# Patient Record
Sex: Female | Born: 1955 | Race: White | Hispanic: No | Smoking: Former smoker
Health system: Southern US, Community
[De-identification: ages and names within clinical notes are randomized; demographics above are authoritative.]

## PROBLEM LIST (undated history)

## (undated) DIAGNOSIS — K219 Gastro-esophageal reflux disease without esophagitis: Secondary | ICD-10-CM

## (undated) DIAGNOSIS — Z8719 Personal history of other diseases of the digestive system: Secondary | ICD-10-CM

## (undated) DIAGNOSIS — K649 Unspecified hemorrhoids: Secondary | ICD-10-CM

## (undated) DIAGNOSIS — R238 Other skin changes: Secondary | ICD-10-CM

## (undated) DIAGNOSIS — R112 Nausea with vomiting, unspecified: Secondary | ICD-10-CM

## (undated) DIAGNOSIS — J189 Pneumonia, unspecified organism: Secondary | ICD-10-CM

## (undated) DIAGNOSIS — R233 Spontaneous ecchymoses: Secondary | ICD-10-CM

## (undated) DIAGNOSIS — N390 Urinary tract infection, site not specified: Secondary | ICD-10-CM

## (undated) DIAGNOSIS — R7303 Prediabetes: Secondary | ICD-10-CM

## (undated) DIAGNOSIS — K8020 Calculus of gallbladder without cholecystitis without obstruction: Secondary | ICD-10-CM

## (undated) DIAGNOSIS — Z9289 Personal history of other medical treatment: Secondary | ICD-10-CM

## (undated) DIAGNOSIS — C4491 Basal cell carcinoma of skin, unspecified: Secondary | ICD-10-CM

## (undated) DIAGNOSIS — J45909 Unspecified asthma, uncomplicated: Secondary | ICD-10-CM

## (undated) HISTORY — PX: KNEE SURGERY: SHX244

## (undated) HISTORY — PX: DILATION AND CURETTAGE OF UTERUS: SHX78

## (undated) HISTORY — DX: Urinary tract infection, site not specified: N39.0

## (undated) HISTORY — DX: Unspecified asthma, uncomplicated: J45.909

## (undated) HISTORY — DX: Unspecified hemorrhoids: K64.9

## (undated) HISTORY — PX: MYOMECTOMY: SHX85

## (undated) HISTORY — PX: BREAST BIOPSY: SHX20

---

## 2009-01-06 ENCOUNTER — Encounter: Payer: Self-pay | Admitting: Internal Medicine

## 2009-01-13 DIAGNOSIS — J4540 Moderate persistent asthma, uncomplicated: Secondary | ICD-10-CM | POA: Insufficient documentation

## 2009-01-13 DIAGNOSIS — J309 Allergic rhinitis, unspecified: Secondary | ICD-10-CM | POA: Insufficient documentation

## 2009-01-13 DIAGNOSIS — K219 Gastro-esophageal reflux disease without esophagitis: Secondary | ICD-10-CM | POA: Insufficient documentation

## 2009-01-13 DIAGNOSIS — E785 Hyperlipidemia, unspecified: Secondary | ICD-10-CM

## 2009-01-14 ENCOUNTER — Ambulatory Visit: Payer: Self-pay | Admitting: Internal Medicine

## 2009-06-05 DIAGNOSIS — I639 Cerebral infarction, unspecified: Secondary | ICD-10-CM

## 2009-06-05 HISTORY — DX: Cerebral infarction, unspecified: I63.9

## 2009-07-13 ENCOUNTER — Ambulatory Visit: Payer: Self-pay | Admitting: Internal Medicine

## 2009-07-26 ENCOUNTER — Encounter: Payer: Self-pay | Admitting: Internal Medicine

## 2009-08-06 ENCOUNTER — Ambulatory Visit: Payer: Self-pay | Admitting: Internal Medicine

## 2009-09-12 ENCOUNTER — Emergency Department: Payer: Self-pay | Admitting: Emergency Medicine

## 2009-11-09 ENCOUNTER — Ambulatory Visit: Payer: Self-pay | Admitting: Cardiovascular Disease

## 2009-11-09 ENCOUNTER — Ambulatory Visit: Payer: Self-pay

## 2009-11-09 ENCOUNTER — Encounter (INDEPENDENT_AMBULATORY_CARE_PROVIDER_SITE_OTHER): Payer: Self-pay | Admitting: Neurology

## 2009-11-09 ENCOUNTER — Ambulatory Visit (HOSPITAL_COMMUNITY): Admission: RE | Admit: 2009-11-09 | Discharge: 2009-11-09 | Payer: Self-pay | Admitting: Neurology

## 2009-11-22 ENCOUNTER — Telehealth (INDEPENDENT_AMBULATORY_CARE_PROVIDER_SITE_OTHER): Payer: Self-pay | Admitting: *Deleted

## 2009-12-11 ENCOUNTER — Emergency Department: Payer: Self-pay | Admitting: Internal Medicine

## 2010-03-31 ENCOUNTER — Ambulatory Visit: Payer: Self-pay

## 2010-06-26 ENCOUNTER — Encounter: Payer: Self-pay | Admitting: *Deleted

## 2010-07-05 NOTE — Assessment & Plan Note (Signed)
Summary: 6 months/apc   Visit Type:  Follow-up Copy to:  Dr. Doran Durand Primary Provider/Referring Provider:  Dr. Doran Durand, [facility]  CC:  Pt here for follow-up. pt never increased advair. pt has no comlaints about breathing. Marland Kitchen  History of Present Illness: IOV 01/14/2009: 55 year old female. Immigrant from [place] since Jan 2009. Has long standing hx of asthma since birth. ASthma is currently very well controlled on advair 100/50, singualir, and theo 24. She has not used rescue inahlers in many years. SHe is on theo-24 for 30 years which apparently  her [place] pulmonologist routinely refills. She has been on advair for >10 years and since then hsa no ER visits. After relocating to [place] she asked Dr. Allena Katz her PMD for refill but per patient Dr. Allena Katz felt that refill on this would be best served by seeing me, a pulmonologist. Therefore, only reason for todays' visit is that she wants her theo refilled. She is very keen on having her theophylline rfilled. Per her "this is the only medicine that keeps the oxygen flowing in her body". She is very keen and adamant that about this fact.  She states that if she does not take her theo even for one dose she will wheeze. She stays stoppin it will "mess body up" and that her body takes "long time to recover when messed up". I told her that this contradicts what optimal control is and that theo is just masking her symptoms without controlling inflammation.  SHe is also reluctant to take higher dose ICS due to fears of osteoporosis.  Spirometry reviwed  today in office shows that she is in the high end of the moderate persistent category of asthma class Fev1 1.74L/79%, FVC 2.38L/81%, Ratio 73 (97), Fef 25-75% 1.28L/48%  REC: Increase from advair 100 to advair 200 in 3 months and then fu 6 months with spirometry. For now continue theophyllineOV 07/13/2009: Followup Moderate Persistent Asthma. Since last visit, she forgot to increase her advair dose to the  250/50 strenght. She is still continuing with theophylline. However, she is ready to quit theo and try the higher advair dose now. STates that in last  6 months asthma has been under control. Denies albuterol use in past 6 months. Denies wheeze, dyspnea, chest pain, cough, edema, fever, chills, sputum, and hemoptysis and weight loss.   CardioPerfect Spirometry  ID: [id number] Patient: Crystal Gonzalez, Crystal Gonzalez DOB: May 10, 1956 Age: 55 Years Old Sex: Female Race: Kren Height: 61 Weight: 171.13 PPD: <0.25 Status: Unconfirmed Past Medical History:  Allergic Rhinitis Asthma G E R D Hyperlipidemia Edema-782.3 Recorded: 07/13/2009 1:41 PM  Parameter  Measured Predicted %Predicted FVC     1.87        3.10        60.40 FEV1     1.34        2.45        54.70 FEV1%   71.48        79.76        89.60 PEF    3.97        6.16        64.50   Interpretation: severe obstruction  Current Medications (verified): 1)  Nasacort Aq 55 Mcg/act Aers (Triamcinolone Acetonide(Nasal)) .... 2 Puffs in Each Nostril Daily 2)  Advair Diskus 100-50 Mcg/dose Aepb (Fluticasone-Salmeterol) .... One Puff Twice Daily 3)  Theo-24 300 Mg Xr24h-Cap (Theophylline) .... Take 1 Tablet By Mouth Once A Day 4)  Albuterol Sulfate (2.5 Mg/5ml) 0.083% Nebu (  Albuterol Sulfate) .... One Vial Every 6 Hours As Needed 5)  Protonix 40 Mg Tbec (Pantoprazole Sodium) .... Take 1 Tablet By Mouth Once A Day 6)  Singulair 10 Mg Tabs (Montelukast Sodium) .... Take 1 Tablet By Mouth Once A Day 7)  Skelaxin 800 Mg Tabs (Metaxalone) .... Take 1 Tablet By Mouth Three Times A Day 8)  Multivitamins  Tabs (Multiple Vitamin) .... Take 1 Tablet By Mouth Once A Day 9)  Vitamin E 600 Unit Caps (Vitamin E) .... Take 1 Tablet By Mouth Once A Day  Allergies (verified): 1)  ! * Dust 2)  ! Iodine 3)  ! * Radish 4)  ! * Be Stings 5)  ! * Tetanus 6)  ! * Peanuts  Past History:  Family History: Last updated: 01/20/2009 Father- deceased from prostate  cancer at age 73 Mother- deceased due to MI at age 28  Social History: Last updated: 01/20/09 retired, disabled widowed Patient states former smoker.  Quit smoking in 1975, started at age 27, 37 ETOH 2-3 beers or wine on weekends Recently moved  with her cats from [place], [place] in Jan 2009  Risk Factors: Smoking Status: quit (01-20-09) Packs/Day: <0.25 (Jan 20, 2009)  Past Medical History: Reviewed history from 01/13/2009 and no changes required. Allergic Rhinitis Asthma G E R D Hyperlipidemia Edema-782.3  Past Surgical History: Reviewed history from 01/20/2009 and no changes required. Surgery in left knee Left Breast Biopsy Fibroid removal  Family History: Reviewed history from 01-20-2009 and no changes required. Father- deceased from prostate cancer at age 60 Mother- deceased due to MI at age 43  Social History: Reviewed history from 20-Jan-2009 and no changes required. retired, disabled widowed Patient states former smoker.  Quit smoking in 1975, started at age 38, 89 ETOH 2-3 beers or wine on weekends Recently moved  with her cats from [place], [place] in Jan 2009  Review of Systems  The patient denies shortness of breath with activity, shortness of breath at rest, productive cough, non-productive cough, coughing up blood, chest pain, irregular heartbeats, acid heartburn, indigestion, loss of appetite, weight change, abdominal pain, difficulty swallowing, sore throat, tooth/dental problems, headaches, nasal congestion/difficulty breathing through nose, sneezing, itching, ear ache, anxiety, depression, hand/feet swelling, joint stiffness or pain, rash, change in color of mucus, and fever.    Vital Signs:  Patient profile:   55 year old female Height:      61 inches Weight:      171.13 pounds O2 Sat:      98 % on Room air Temp:     98.0 degrees F oral Pulse rate:   75 / minute BP sitting:   120 / 72  (right arm) Cuff size:   regular  Vitals Entered  By: Carron Curie CMA (July 13, 2009 1:46 PM)  O2 Flow:  Room air CC: Pt here for follow-up. pt never increased advair. pt has no comlaints about breathing.  Comments Medications reviewed with patient Carron Curie CMA  July 13, 2009 1:46 PM Daytime phone number verified with patient.    Physical Exam  General:  well developed, well nourished, in no acute distress. obese.   Head:  normocephalic and atraumatic Eyes:  PERRLA/EOM intact; conjunctiva and sclera clear Ears:  TMs intact and clear with normal canals Nose:  no deformity, discharge, inflammation, or lesions Mouth:  no deformity or lesions Neck:  no masses, thyromegaly, or abnormal cervical nodes Chest Wall:  no deformities noted Lungs:  clear bilaterally to  auscultation and percussion Heart:  regular rate and rhythm, S1, S2 without murmurs, rubs, gallops, or clicks Abdomen:  bowel sounds positive; abdomen soft and non-tender without masses, or organomegaly Msk:  no deformity or scoliosis noted with normal posture Pulses:  pulses normal Extremities:  no clubbing, cyanosis, edema, or deformity noted Neurologic:  CN II-XII grossly intact with normal reflexes, coordination, muscle strength and tone Skin:  intact without lesions or rashes Cervical Nodes:  no significant adenopathy Axillary Nodes:  no significant adenopathy Psych:  alert and cooperative; normal mood and affect; normal attention span and concentration   Pulmonary Function Test Date: 07/13/2009 1:41 PM Gender: Female  Pre-Spirometry FVC    Value: 1.87 L/min   % Pred: 60.40 % FEV1    Value: 1.34 L     Pred: 2.45 L     % Pred: 54.70 % FEV1/FVC  Value: 71.48 %     % Pred: 89.60 %  Comments: significant worsening compared to before (personally reviewed)  Evaluation: moderate obstruction with NO significant bronchodilator response  Impression & Recommendations:  Problem # 1:  ASTHMA (ICD-493.90) Assessment Deteriorated Subjectively  unchanged and in very well controlled category. However, objectively fev1 shows worsening although clinically not in exacerbation.  She is willing to come off theo and give higher dose advair a try. So, today we will do that. SHe has been cautiond to watch for increased albuterol use and symptoms. Iwill see her again 3-4 weeks with spirometry. In interim, if she worsens she will contact us or come sooner.  Medications Added to Medication List This Visit: 1)  Advair Diskus 250-50 Mcg/dose Misc (Fluticasone-salmeterol) .... One puff twice daily  Other Orders: Est. Patient Level III (14782)  Patient Instructions: 1)  STop theophylline 2)  start advair 250-50 1 puff two times a day (demonstrate technique) 3)  Very important you monitor your symptoms 4)  Take albuterol as needed 2 puff  5)  If you are not feeling well or taking albuterol more than few times daily call us or visit [place] 6)  Otherwise, return in 3-4 weeks 7)  Spirometry at followup Prescriptions: ADVAIR DISKUS 250-50 MCG/DOSE  MISC (FLUTICASONE-SALMETEROL) One puff twice daily  #1 x 6   Entered and Authorized by:   Kalman Shan MD   Signed by:   Kalman Shan MD on 07/13/2009   Method used:   Electronically to        [facility]  [address] [phone] (retail)       [address]       [place], [place]  [address]       Ph: [phone]       Fax: [phone]   RxID:   [id number]

## 2010-07-05 NOTE — Progress Notes (Signed)
  Phone Note From Other Clinic   Caller: JAN/GUILFORD NEURO Initial call taken by: KM    TEE faxed to [phone] [facility]  November 22, 2009 9:26 AM
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
## 2010-07-05 NOTE — Letter (Signed)
Summary: [facility]   Imported By: Lester Cordova 08/10/2009 08:57:07  _____________________________________________________________________  External Attachment:    Type:   Image     Comment:   External Document

## 2010-07-05 NOTE — Letter (Signed)
Summary: Discharged as patient from [facility]   [facility]  [address]   [place], [place] [address]   Phone: [phone]  Fax: [phone]       08/06/2009 MRN: [id number]  [facility] [address] [place], [place]  [address]  Dear Ms. Daughenbaugh,   I find it necessary to inform you that I will not be able to provide medical care to you, because of your desire to seek another pulmonlogist outside the [facility]. This is because you do not agree with our recommendation.   Since your condition requires medical attention, I suggest that you place your self under the care of another physician without delay. If you desire, I will be available for emergency care for 30 days after you receive this letter.  This should give you ample time to select a physician of your choice from the many competent providers in this area. You may want to call the local medical society or [facility]'s physician referral service [phone]) for their assistance in locating a new physician. With your written authorization, I will make a copy of your medical record available to your new physician.   Sincerely,    Kalman Shan MD

## 2013-06-20 DIAGNOSIS — Z85828 Personal history of other malignant neoplasm of skin: Secondary | ICD-10-CM | POA: Diagnosis not present

## 2013-06-20 DIAGNOSIS — D235 Other benign neoplasm of skin of trunk: Secondary | ICD-10-CM | POA: Diagnosis not present

## 2013-06-20 DIAGNOSIS — D1801 Hemangioma of skin and subcutaneous tissue: Secondary | ICD-10-CM | POA: Diagnosis not present

## 2013-07-03 ENCOUNTER — Encounter: Payer: Self-pay | Admitting: Family Medicine

## 2013-07-03 ENCOUNTER — Ambulatory Visit (INDEPENDENT_AMBULATORY_CARE_PROVIDER_SITE_OTHER): Payer: Medicare Other | Admitting: Family Medicine

## 2013-07-03 VITALS — BP 122/62 | HR 78 | Temp 98.4°F | Ht 59.25 in | Wt 186.0 lb

## 2013-07-03 DIAGNOSIS — J309 Allergic rhinitis, unspecified: Secondary | ICD-10-CM

## 2013-07-03 DIAGNOSIS — G459 Transient cerebral ischemic attack, unspecified: Secondary | ICD-10-CM | POA: Diagnosis not present

## 2013-07-03 DIAGNOSIS — J45909 Unspecified asthma, uncomplicated: Secondary | ICD-10-CM | POA: Diagnosis not present

## 2013-07-03 DIAGNOSIS — Z8673 Personal history of transient ischemic attack (TIA), and cerebral infarction without residual deficits: Secondary | ICD-10-CM | POA: Insufficient documentation

## 2013-07-03 DIAGNOSIS — J4540 Moderate persistent asthma, uncomplicated: Secondary | ICD-10-CM

## 2013-07-03 MED ORDER — TRIAMCINOLONE ACETONIDE 55 MCG/ACT NA AERO: 2.0000 | INHALATION_SPRAY | Freq: Every day | NASAL | Status: AC

## 2013-07-03 MED ORDER — FLUTICASONE-SALMETEROL 250-50 MCG/DOSE IN AEPB: 1.0000 | INHALATION_SPRAY | Freq: Two times a day (BID) | RESPIRATORY_TRACT | Status: DC

## 2013-07-03 MED ORDER — EPINEPHRINE 0.3 MG/0.3ML IJ SOAJ: 0.3000 mg | Freq: Once | INTRAMUSCULAR | Status: DC

## 2013-07-03 MED ORDER — ALBUTEROL SULFATE HFA 108 (90 BASE) MCG/ACT IN AERS: 2.0000 | INHALATION_SPRAY | Freq: Four times a day (QID) | RESPIRATORY_TRACT | Status: DC | PRN

## 2013-07-03 NOTE — Progress Notes (Signed)
Pre-visit discussion using our clinic review tool. No additional management support is needed unless otherwise documented below in the visit note.  

## 2013-07-03 NOTE — Addendum Note (Signed)
Addended by: Carter Kitten on: 07/03/2013 12:58 PM   Modules accepted: Orders

## 2013-07-03 NOTE — Assessment & Plan Note (Signed)
Well-controlled on current meds 

## 2013-07-03 NOTE — Patient Instructions (Addendum)
It was great to meet you! Schedule CPX with fasting labs prior.

## 2013-07-03 NOTE — Assessment & Plan Note (Addendum)
Spirometry showed moderate obstruction. She is not interested in increasing advair at this time.  Given albuterol inhaler for rescue.

## 2013-07-03 NOTE — Progress Notes (Signed)
   Subjective:    Patient ID: Crystal Gonzalez, female    DOB: 04-28-1956, 59 y.o.   MRN: [id number]  HPI  58 year old female presents to establish. Originally from [place].  She previously was seen at [facility].  Last CPX in 2014 Last mammogram 6 yesars ago. Last vaginal exam 6 years ago.  Seeing Dr. Tamera Punt for fracture in right arm 2 years ago. Still having current a[pin. Also seeing him for left knee pain. Has lump behind left knee.   Elevated Cholesterol: Last time cholesterol was check was years ago. Using medications without problems:NOne Diet compliance: Moderate, drinks beer 3 a day Exercise: Rare Other complaints:  Well controlled asthma and allergies on current regimen.  Has not used albuterol in several years.  Had TIA 2011  Seen by Dr. Krista Blue.  Neg carotids  MRI showed small mictro vascular changes( also had some other change she is not sure of). She has some mild weakness in right arm. She has been falling  Frequently since TIA...loses balance. She does think her left leg is week due to make knee issues.  Medication triggered reflux: Well controlled on prevacid.  Review of Systems  Constitutional: Negative for fever and fatigue.  HENT: Negative for ear pain.   Eyes: Negative for pain.  Respiratory: Negative for chest tightness and shortness of breath.   Cardiovascular: Negative for chest pain, palpitations and leg swelling.  Gastrointestinal: Negative for abdominal pain.  Genitourinary: Negative for dysuria.       Objective:   Physical Exam  Constitutional: Vital signs are normal. She appears well-developed and well-nourished. She is cooperative.  Non-toxic appearance. She does not appear ill. No distress.  obese  HENT:  Head: Normocephalic.  Right Ear: Hearing, tympanic membrane, external ear and ear canal normal.  Left Ear: Hearing, tympanic membrane, external ear and ear canal normal.  Nose: Nose normal.  Eyes: Conjunctivae, EOM and lids are normal. Pupils  are equal, round, and reactive to light. Lids are everted and swept, no foreign bodies found.  Neck: Trachea normal and normal range of motion. Neck supple. Carotid bruit is not present. No mass and no thyromegaly present.  Cardiovascular: Normal rate, regular rhythm, S1 normal, S2 normal, normal heart sounds and intact distal pulses.  Exam reveals no gallop.   No murmur heard. Pulmonary/Chest: Effort normal and breath sounds normal. No respiratory distress. She has no wheezes. She has no rhonchi. She has no rales.  Abdominal: Soft. Normal appearance and bowel sounds are normal. She exhibits no distension, no fluid wave, no abdominal bruit and no mass. There is no hepatosplenomegaly. There is no tenderness. There is no rebound, no guarding and no CVA tenderness. No hernia.  Lymphadenopathy:    She has no cervical adenopathy.    She has no axillary adenopathy.  Neurological: She is alert. She has normal strength. No cranial nerve deficit or sensory deficit.  Skin: Skin is warm, dry and intact. No rash noted.  Psychiatric: Her speech is normal and behavior is normal. Judgment normal. Her mood appears not anxious. Cognition and memory are normal. She does not exhibit a depressed mood.          Assessment & Plan:  Refuses flu and pneumonia.

## 2013-07-11 DIAGNOSIS — M25539 Pain in unspecified wrist: Secondary | ICD-10-CM | POA: Diagnosis not present

## 2013-07-11 DIAGNOSIS — M25569 Pain in unspecified knee: Secondary | ICD-10-CM | POA: Diagnosis not present

## 2013-08-22 DIAGNOSIS — M6750 Plica syndrome, unspecified knee: Secondary | ICD-10-CM | POA: Diagnosis not present

## 2013-08-30 DIAGNOSIS — M1710 Unilateral primary osteoarthritis, unspecified knee: Secondary | ICD-10-CM | POA: Diagnosis not present

## 2013-09-03 DIAGNOSIS — M25569 Pain in unspecified knee: Secondary | ICD-10-CM | POA: Diagnosis not present

## 2013-09-19 ENCOUNTER — Telehealth: Payer: Self-pay | Admitting: Family Medicine

## 2013-09-19 ENCOUNTER — Other Ambulatory Visit (INDEPENDENT_AMBULATORY_CARE_PROVIDER_SITE_OTHER): Payer: Medicare Other

## 2013-09-19 DIAGNOSIS — E785 Hyperlipidemia, unspecified: Secondary | ICD-10-CM | POA: Diagnosis not present

## 2013-09-19 LAB — LIPID PANEL
Cholesterol: 204 mg/dL — ABNORMAL HIGH (ref 0–200)
HDL: 68.3 mg/dL
LDL Cholesterol: 112 mg/dL — ABNORMAL HIGH (ref 0–99)
Total CHOL/HDL Ratio: 3
Triglycerides: 117 mg/dL (ref 0.0–149.0)
VLDL: 23.4 mg/dL (ref 0.0–40.0)

## 2013-09-19 LAB — COMPREHENSIVE METABOLIC PANEL WITH GFR
ALT: 29 U/L (ref 0–35)
AST: 24 U/L (ref 0–37)
Albumin: 3.8 g/dL (ref 3.5–5.2)
Alkaline Phosphatase: 72 U/L (ref 39–117)
BUN: 18 mg/dL (ref 6–23)
CO2: 28 meq/L (ref 19–32)
Calcium: 10 mg/dL (ref 8.4–10.5)
Chloride: 104 meq/L (ref 96–112)
Creatinine, Ser: 0.8 mg/dL (ref 0.4–1.2)
GFR: 80.95 mL/min
Glucose, Bld: 70 mg/dL (ref 70–99)
Potassium: 4.3 meq/L (ref 3.5–5.1)
Sodium: 141 meq/L (ref 135–145)
Total Bilirubin: 0.4 mg/dL (ref 0.3–1.2)
Total Protein: 7.5 g/dL (ref 6.0–8.3)

## 2013-09-19 NOTE — Telephone Encounter (Signed)
Message copied by Jinny Sanders on Fri Sep 19, 2013  8:19 AM ------      Message from: Ellamae Sia      Created: Thu Sep 04, 2013  3:21 PM      Regarding: Lab orders for Friday, 4.17.15       Patient is scheduled for CPX labs, please order future labs, Thanks , Crystal Gonzalez       ------

## 2013-09-26 ENCOUNTER — Encounter: Payer: Medicare Other | Admitting: Family Medicine

## 2013-10-17 ENCOUNTER — Encounter: Payer: Self-pay | Admitting: Family Medicine

## 2013-10-17 ENCOUNTER — Ambulatory Visit (INDEPENDENT_AMBULATORY_CARE_PROVIDER_SITE_OTHER): Payer: Medicare Other | Admitting: Family Medicine

## 2013-10-17 VITALS — BP 126/66 | HR 68 | Temp 98.1°F | Ht 59.0 in | Wt 184.0 lb

## 2013-10-17 DIAGNOSIS — Z0000 Encounter for general adult medical examination without abnormal findings: Secondary | ICD-10-CM | POA: Diagnosis not present

## 2013-10-17 DIAGNOSIS — E785 Hyperlipidemia, unspecified: Secondary | ICD-10-CM

## 2013-10-17 DIAGNOSIS — Z1231 Encounter for screening mammogram for malignant neoplasm of breast: Secondary | ICD-10-CM

## 2013-10-17 NOTE — Patient Instructions (Addendum)
Increase exerise as tolerated. Consider starting baby aspirin 81 mg daily to prevent stroke.  Stop high fat cholesterol  Foods and cheese.. Eat mainly low fat skim milk mozzarella. Stop at front desk for setting up first mammogram in the area.  Stop at the lab on way ut to pick up stool test. Return in 6 months for cholesterol check , fasting labs prior.   Hypercholesterolemia High Blood Cholesterol Cholesterol is a Soucy, waxy, fat-like protein needed by your body in small amounts. The liver makes all the cholesterol you need. It is carried from the liver by the blood through the blood vessels. Deposits (plaque) may build up on blood vessel walls. This makes the arteries narrower and stiffer. Plaque increases the risk for heart attack and stroke. You cannot feel your cholesterol level even if it is very high. The only way to know is by a blood test to check your lipid (fats) levels. Once you know your cholesterol levels, you should keep a record of the test results. Work with your caregiver to to keep your levels in the desired range. WHAT THE RESULTS MEAN:  Total cholesterol is a rough measure of all the cholesterol in your blood.  LDL is the so-called bad cholesterol. This is the type that deposits cholesterol in the walls of the arteries. You want this level to be low.  HDL is the good cholesterol because it cleans the arteries and carries the LDL away. You want this level to be high.  Triglycerides are fat that the body can either burn for energy or store. High levels are closely linked to heart disease. DESIRED LEVELS:  Total cholesterol below 200.  LDL below 100 for people at risk, below 70 for very high risk.  HDL above 50 is good, above 60 is best.  Triglycerides below 150. HOW TO LOWER YOUR CHOLESTEROL:  Diet.  Choose fish or Frison meat chicken and [place], roasted or baked. Limit fatty cuts of red meat, fried foods, and processed meats, such as sausage and lunch  meat.  Eat lots of fresh fruits and vegetables. Choose whole grains, beans, pasta, potatoes and cereals.  Use only small amounts of olive, corn or canola oils. Avoid butter, mayonnaise, shortening or palm kernel oils. Avoid foods with trans-fats.  Use skim/nonfat milk and low-fat/nonfat yogurt and cheeses. Avoid whole milk, cream, ice cream, egg yolks and cheeses. Healthy desserts include angel food cake, gingersnaps, animal crackers, hard candy, popsicles, and low-fat/nonfat frozen yogurt. Avoid pastries, cakes, pies and cookies.  Exercise.  A regular program helps decrease LDL and raises HDL.  Helps with weight control.  Do things that increase your activity level like gardening, walking, or taking the stairs.  Medication.  May be prescribed by your caregiver to help lowering cholesterol and the risk for heart disease.  You may need medicine even if your levels are normal if you have several risk factors. HOME CARE INSTRUCTIONS   Follow your diet and exercise programs as suggested by your caregiver.  Take medications as directed.  Have blood work done when your caregiver feels it is necessary. MAKE SURE YOU:   Understand these instructions.  Will watch your condition.  Will get help right away if you are not doing well or get worse. Document Released: 05/22/2005 Document Revised: 08/14/2011 Document Reviewed: 11/07/2006 [facility] Patient Information 2014 [place].  Fat and Cholesterol Control Diet Fat and cholesterol levels in your blood and organs are influenced by your diet. High levels of fat and cholesterol  may lead to diseases of the heart, small and large blood vessels, gallbladder, liver, and pancreas. CONTROLLING FAT AND CHOLESTEROL WITH DIET Although exercise and lifestyle factors are important, your diet is key. That is because certain foods are known to raise cholesterol and others to lower it. The goal is to balance foods for their effect on cholesterol  and more importantly, to replace saturated and trans fat with other types of fat, such as monounsaturated fat, polyunsaturated fat, and omega-3 fatty acids. On average, a person should consume no more than 15 to 17 g of saturated fat daily. Saturated and trans fats are considered "bad" fats, and they will raise LDL cholesterol. Saturated fats are primarily found in animal products such as meats, butter, and cream. However, that does not mean you need to give up all your favorite foods. Today, there are good tasting, low-fat, low-cholesterol substitutes for most of the things you like to eat. Choose low-fat or nonfat alternatives. Choose round or loin cuts of red meat. These types of cuts are lowest in fat and cholesterol. Chicken (without the skin), fish, veal, and ground [place] breast are great choices. Eliminate fatty meats, such as hot dogs and salami. Even shellfish have little or no saturated fat. Have a 3 oz (85 g) portion when you eat lean meat, poultry, or fish. Trans fats are also called "partially hydrogenated oils." They are oils that have been scientifically manipulated so that they are solid at room temperature resulting in a longer shelf life and improved taste and texture of foods in which they are added. Trans fats are found in stick margarine, some tub margarines, cookies, crackers, and baked goods.  When baking and cooking, oils are a great substitute for butter. The monounsaturated oils are especially beneficial since it is believed they lower LDL and raise HDL. The oils you should avoid entirely are saturated tropical oils, such as coconut and palm.  Remember to eat a lot from food groups that are naturally free of saturated and trans fat, including fish, fruit, vegetables, beans, grains (barley, rice, couscous, bulgur wheat), and pasta (without cream sauces).  IDENTIFYING FOODS THAT LOWER FAT AND CHOLESTEROL  Soluble fiber may lower your cholesterol. This type of fiber is found in fruits  such as apples, vegetables such as broccoli, potatoes, and carrots, legumes such as beans, peas, and lentils, and grains such as barley. Foods fortified with plant sterols (phytosterol) may also lower cholesterol. You should eat at least 2 g per day of these foods for a cholesterol lowering effect.  Read package labels to identify low-saturated fats, trans fat free, and low-fat foods at the supermarket. Select cheeses that have only 2 to 3 g saturated fat per ounce. Use a heart-healthy tub margarine that is free of trans fats or partially hydrogenated oil. When buying baked goods (cookies, crackers), avoid partially hydrogenated oils. Breads and muffins should be made from whole grains (whole-wheat or whole oat flour, instead of "flour" or "enriched flour"). Buy non-creamy canned soups with reduced salt and no added fats.  FOOD PREPARATION TECHNIQUES  Never deep-fry. If you must fry, either stir-fry, which uses very little fat, or use non-stick cooking sprays. When possible, broil, bake, or roast meats, and steam vegetables. Instead of putting butter or margarine on vegetables, use lemon and herbs, applesauce, and cinnamon (for squash and sweet potatoes). Use nonfat yogurt, salsa, and low-fat dressings for salads.  LOW-SATURATED FAT / LOW-FAT FOOD SUBSTITUTES Meats / Saturated Fat (g)  Avoid: Steak, marbled (3  oz/85 g) / 11 g  Choose: Steak, lean (3 oz/85 g) / 4 g  Avoid: Hamburger (3 oz/85 g) / 7 g  Choose: Hamburger, lean (3 oz/85 g) / 5 g  Avoid: Ham (3 oz/85 g) / 6 g  Choose: Ham, lean cut (3 oz/85 g) / 2.4 g  Avoid: Chicken, with skin, dark meat (3 oz/85 g) / 4 g  Choose: Chicken, skin removed, dark meat (3 oz/85 g) / 2 g  Avoid: Chicken, with skin, light meat (3 oz/85 g) / 2.5 g  Choose: Chicken, skin removed, light meat (3 oz/85 g) / 1 g Dairy / Saturated Fat (g)  Avoid: Whole milk (1 cup) / 5 g  Choose: Low-fat milk, 2% (1 cup) / 3 g  Choose: Low-fat milk, 1% (1 cup) / 1.5  g  Choose: Skim milk (1 cup) / 0.3 g  Avoid: Hard cheese (1 oz/28 g) / 6 g  Choose: Skim milk cheese (1 oz/28 g) / 2 to 3 g  Avoid: Cottage cheese, 4% fat (1 cup) / 6.5 g  Choose: Low-fat cottage cheese, 1% fat (1 cup) / 1.5 g  Avoid: Ice cream (1 cup) / 9 g  Choose: Sherbet (1 cup) / 2.5 g  Choose: Nonfat frozen yogurt (1 cup) / 0.3 g  Choose: Frozen fruit bar / trace  Avoid: Whipped cream (1 tbs) / 3.5 g  Choose: Nondairy whipped topping (1 tbs) / 1 g Condiments / Saturated Fat (g)  Avoid: Mayonnaise (1 tbs) / 2 g  Choose: Low-fat mayonnaise (1 tbs) / 1 g  Avoid: Butter (1 tbs) / 7 g  Choose: Extra light margarine (1 tbs) / 1 g  Avoid: Coconut oil (1 tbs) / 11.8 g  Choose: Olive oil (1 tbs) / 1.8 g  Choose: Corn oil (1 tbs) / 1.7 g  Choose: Safflower oil (1 tbs) / 1.2 g  Choose: Sunflower oil (1 tbs) / 1.4 g  Choose: Soybean oil (1 tbs) / 2.4 g  Choose: Canola oil (1 tbs) / 1 g Document Released: 05/22/2005 Document Revised: 09/16/2012 Document Reviewed: 11/10/2010 [employer] Patient Information 2014 [place], [place].

## 2013-10-17 NOTE — Progress Notes (Signed)
Pre visit review using our clinic review tool, if applicable. No additional management support is needed unless otherwise documented below in the visit note. 

## 2013-10-17 NOTE — Progress Notes (Signed)
   Subjective:    Patient ID: Crystal Gonzalez, female    DOB: 01-Jan-1956, 58 y.o.   MRN: [id number]  HPI The patient is here for annual wellness exam and preventative care.     LDL at goal <70, due to TIA. She refuses statins or any natural med for chol. Lab Results  Component Value Date   CHOL 204* 09/19/2013   HDL 68.30 09/19/2013   LDLCALC 112* 09/19/2013   TRIG 117.0 09/19/2013   CHOLHDL 3 09/19/2013   No diabetes, nml liver and kidney function.   Limited exercise.   Review of Systems  Constitutional: Negative for fever and fatigue.  HENT: Negative for ear pain.   Eyes: Negative for pain.  Respiratory: Negative for chest tightness and shortness of breath.   Cardiovascular: Negative for chest pain, palpitations and leg swelling.  Gastrointestinal: Negative for abdominal pain.  Genitourinary: Negative for dysuria.  Musculoskeletal: Positive for arthralgias and back pain.       Objective:   Physical Exam  Constitutional: Vital signs are normal. She appears well-developed and well-nourished. She is cooperative.  Non-toxic appearance. She does not appear ill. No distress.  HENT:  Head: Normocephalic.  Right Ear: Hearing, tympanic membrane, external ear and ear canal normal.  Left Ear: Hearing, tympanic membrane, external ear and ear canal normal.  Nose: Nose normal.  Eyes: Conjunctivae, EOM and lids are normal. Pupils are equal, round, and reactive to light. Lids are everted and swept, no foreign bodies found.  Neck: Trachea normal and normal range of motion. Neck supple. Carotid bruit is not present. No mass and no thyromegaly present.  Cardiovascular: Normal rate, regular rhythm, S1 normal, S2 normal, normal heart sounds and intact distal pulses.  Exam reveals no gallop.   No murmur heard. Pulmonary/Chest: Effort normal and breath sounds normal. No respiratory distress. She has no wheezes. She has no rhonchi. She has no rales.  Abdominal: Soft. Normal appearance and bowel  sounds are normal. She exhibits no distension, no fluid wave, no abdominal bruit and no mass. There is no hepatosplenomegaly. There is no tenderness. There is no rebound, no guarding and no CVA tenderness. No hernia.  Genitourinary: No breast swelling, tenderness, discharge or bleeding.  Refused pelvic exam today  Lymphadenopathy:    She has no cervical adenopathy.    She has no axillary adenopathy.  Neurological: She is alert. She has normal strength. No cranial nerve deficit or sensory deficit.  Skin: Skin is warm, dry and intact. No rash noted.  Psychiatric: Her speech is normal and behavior is normal. Judgment normal. Her mood appears not anxious. Cognition and memory are normal. She does not exhibit a depressed mood.          Assessment & Plan:  The patient's preventative maintenance and recommended screening tests for an annual wellness exam were reviewed in full today. Brought up to date unless services declined.  Counselled on the importance of diet, exercise, and its role in overall health and mortality. The patient's FH and SH was reviewed, including their home life, tobacco status, and drug and alcohol status.    Last mammogram 6 years ago.  Due now. Last vaginal exam 6 years ago. Due now, she refuses this now, will consider next year. Colonoscopy: 2003 nml, due for repeat but will instead plan ifob. Vaccines: Due Tdap but she is allergic. Nonsmoker

## 2013-10-17 NOTE — Addendum Note (Signed)
Addended byEliezer Lofts E on: 10/17/2013 04:01 PM   Modules accepted: Orders

## 2013-10-17 NOTE — Assessment & Plan Note (Signed)
Refuses treatment natural or other.

## 2013-12-23 ENCOUNTER — Other Ambulatory Visit: Payer: Self-pay | Admitting: *Deleted

## 2013-12-23 MED ORDER — MONTELUKAST SODIUM 10 MG PO TABS: 10.0000 mg | ORAL_TABLET | Freq: Every day | ORAL | Status: DC

## 2014-01-03 ENCOUNTER — Other Ambulatory Visit: Payer: Self-pay | Admitting: Family Medicine

## 2014-03-30 ENCOUNTER — Telehealth: Payer: Self-pay

## 2014-03-30 MED ORDER — HALOBETASOL PROPIONATE 0.05 % EX CREA: TOPICAL_CREAM | Freq: Two times a day (BID) | CUTANEOUS | Status: DC

## 2014-03-30 NOTE — Telephone Encounter (Signed)
Pt request refill halobetasol propionate cream 0.05 %; not on pts med list; was given to pt by doctor pt used to go to; pt is allergic to underarm deodorant. Pt request cb. Spoke with pt and she applies cream to underarm prior to using deodorant. [facility] [place]. Pt presently has rash on both arms and hands and legs.Pt prefers not to be seen and request med sent to pharmacy. Pt request cb.

## 2014-03-30 NOTE — Telephone Encounter (Signed)
Rx sent in

## 2014-03-31 NOTE — Telephone Encounter (Signed)
Lile notified prescription has been sent to her pharmacy.

## 2014-05-14 DIAGNOSIS — R05 Cough: Secondary | ICD-10-CM | POA: Diagnosis not present

## 2014-05-14 DIAGNOSIS — J4530 Mild persistent asthma, uncomplicated: Secondary | ICD-10-CM | POA: Diagnosis not present

## 2014-05-14 DIAGNOSIS — J060 Acute laryngopharyngitis: Secondary | ICD-10-CM | POA: Diagnosis not present

## 2014-05-14 DIAGNOSIS — R1312 Dysphagia, oropharyngeal phase: Secondary | ICD-10-CM | POA: Diagnosis not present

## 2014-05-14 DIAGNOSIS — J302 Other seasonal allergic rhinitis: Secondary | ICD-10-CM | POA: Diagnosis not present

## 2014-06-21 ENCOUNTER — Other Ambulatory Visit: Payer: Self-pay | Admitting: Family Medicine

## 2014-07-10 DIAGNOSIS — L272 Dermatitis due to ingested food: Secondary | ICD-10-CM | POA: Diagnosis not present

## 2014-09-10 DIAGNOSIS — L259 Unspecified contact dermatitis, unspecified cause: Secondary | ICD-10-CM | POA: Diagnosis not present

## 2014-09-10 DIAGNOSIS — D485 Neoplasm of uncertain behavior of skin: Secondary | ICD-10-CM | POA: Diagnosis not present

## 2014-09-15 DIAGNOSIS — M545 Low back pain: Secondary | ICD-10-CM | POA: Diagnosis not present

## 2014-11-27 ENCOUNTER — Other Ambulatory Visit: Payer: Self-pay | Admitting: Family Medicine

## 2014-12-14 ENCOUNTER — Other Ambulatory Visit: Payer: Self-pay | Admitting: Family Medicine

## 2014-12-14 MED ORDER — MONTELUKAST SODIUM 10 MG PO TABS: 10.0000 mg | ORAL_TABLET | Freq: Every day | ORAL | Status: DC

## 2014-12-14 NOTE — Addendum Note (Signed)
Addended by: Carter Kitten on: 12/14/2014 12:57 PM   Modules accepted: Orders

## 2014-12-17 ENCOUNTER — Ambulatory Visit (INDEPENDENT_AMBULATORY_CARE_PROVIDER_SITE_OTHER): Payer: Medicare Other | Admitting: Family Medicine

## 2014-12-17 ENCOUNTER — Encounter: Payer: Self-pay | Admitting: Family Medicine

## 2014-12-17 VITALS — BP 120/60 | HR 74 | Temp 97.9°F | Ht 59.0 in | Wt 190.0 lb

## 2014-12-17 DIAGNOSIS — K219 Gastro-esophageal reflux disease without esophagitis: Secondary | ICD-10-CM | POA: Diagnosis not present

## 2014-12-17 DIAGNOSIS — J4540 Moderate persistent asthma, uncomplicated: Secondary | ICD-10-CM | POA: Diagnosis not present

## 2014-12-17 DIAGNOSIS — R6881 Early satiety: Secondary | ICD-10-CM | POA: Diagnosis not present

## 2014-12-17 DIAGNOSIS — K649 Unspecified hemorrhoids: Secondary | ICD-10-CM | POA: Diagnosis not present

## 2014-12-17 DIAGNOSIS — K644 Residual hemorrhoidal skin tags: Secondary | ICD-10-CM | POA: Insufficient documentation

## 2014-12-17 MED ORDER — ALBUTEROL SULFATE (2.5 MG/3ML) 0.083% IN NEBU: 2.5000 mg | INHALATION_SOLUTION | Freq: Four times a day (QID) | RESPIRATORY_TRACT | Status: AC | PRN

## 2014-12-17 MED ORDER — ALBUTEROL SULFATE HFA 108 (90 BASE) MCG/ACT IN AERS: 2.0000 | INHALATION_SPRAY | Freq: Four times a day (QID) | RESPIRATORY_TRACT | Status: AC | PRN

## 2014-12-17 MED ORDER — EPINEPHRINE 0.3 MG/0.3ML IJ SOAJ: 0.3000 mg | Freq: Once | INTRAMUSCULAR | Status: DC

## 2014-12-17 MED ORDER — MONTELUKAST SODIUM 10 MG PO TABS: 10.0000 mg | ORAL_TABLET | Freq: Every day | ORAL | Status: DC

## 2014-12-17 MED ORDER — HYDROCORTISONE 2.5 % RE CREA: 1.0000 "application " | TOPICAL_CREAM | Freq: Two times a day (BID) | RECTAL | Status: DC

## 2014-12-17 MED ORDER — PANTOPRAZOLE SODIUM 40 MG PO TBEC: 40.0000 mg | DELAYED_RELEASE_TABLET | Freq: Every day | ORAL | Status: DC

## 2014-12-17 MED ORDER — FLUTICASONE-SALMETEROL 250-50 MCG/DOSE IN AEPB: INHALATION_SPRAY | RESPIRATORY_TRACT | Status: DC

## 2014-12-17 NOTE — Assessment & Plan Note (Signed)
Well controlled on advair. Refilled albuterol nebs and inh.nh, pt requested nebs for rescue " in case"

## 2014-12-17 NOTE — Assessment & Plan Note (Signed)
No GERD, on PPI.  Drinking a lot of water.  Decrease alcohol.  If continuing consider referral to GI. No clear indication for CXR today.

## 2014-12-17 NOTE — Progress Notes (Signed)
59 year old female presents for follow up chronic issues and med refill.  LDL  NO LONGER at goal <70, due to TIA. She refuses statins or any natural med for chol. Lab Results  Component Value Date   CHOL 204* 09/19/2013   HDL 68.30 09/19/2013   LDLCALC 112* 09/19/2013   TRIG 117.0 09/19/2013   CHOLHDL 3 09/19/2013   No diabetes, nml liver and kidney function.  Asthma, moderate persistent, allergic rhinitis, well controlled: On advair, Singulair, nasocort.  Using albuterol rarely. She did have some wheeze when walking [place].  GERD, well contolled on protonix.  She fills up very easily. Can only eat a small portion of meals. She is concerned about hiatal hernia and requests a CXR. No N/V.  Using probiotics. Ongoing for 1 year.  Wt Readings from Last 3 Encounters:  12/17/14 190 lb (86.183 kg)  10/17/13 184 lb (83.462 kg)  07/03/13 186 lb (84.369 kg)   Diet: moderate, drinks a lot of water. Exercise:none  Has ext  hemmorhoids itching rectum, no bleeding   Review of Systems  Constitutional: Negative for fever and fatigue.  HENT: Negative for ear pain.  Eyes: Negative for pain.  Respiratory: Negative for chest tightness and shortness of breath.  Cardiovascular: Negative for chest pain, palpitations and leg swelling.  Gastrointestinal: Negative for abdominal pain.  Genitourinary: Negative for dysuria.  .       Objective:   Physical Exam  Constitutional:  OBESE Vital signs are normal. She appears well-developed and well-nourished. She is cooperative. Non-toxic appearance. She does not appear ill. No distress.  HENT:  Head: Normocephalic.  Right Ear: Hearing, tympanic membrane, external ear and ear canal normal.  Left Ear: Hearing, tympanic membrane, external ear and ear canal normal.  Nose: Nose normal.  Eyes: Conjunctivae, EOM and lids are normal. Pupils are equal, round, and reactive to light. Lids are everted and swept, no foreign bodies found.   Neck: Trachea normal and normal range of motion. Neck supple. Carotid bruit is not present. No mass and no thyromegaly present.  Cardiovascular: Normal rate, regular rhythm, S1 normal, S2 normal, normal heart sounds and intact distal pulses. Exam reveals no gallop.  No murmur heard. Pulmonary/Chest: Effort normal and breath sounds normal. No respiratory distress. She has no wheezes. She has no rhonchi. She has no rales.  Abdominal: Soft. Normal appearance and bowel sounds are normal. She exhibits no distension, no fluid wave, no abdominal bruit and no mass. There is no hepatosplenomegaly. There is no tenderness. There is no rebound, no guarding and no CVA tenderness. No hernia.  Genitourinary: No breast swelling, tenderness, discharge or bleeding.  Refused pelvic exam today  Lymphadenopathy:   She has no cervical adenopathy.   She has no axillary adenopathy.  Neurological: She is alert. She has normal strength. No cranial nerve deficit or sensory deficit.  Skin: Skin is warm, dry and intact. No rash noted.  Psychiatric: Her speech is normal and behavior is normal. Judgment normal. Her mood appears not anxious. Cognition and memory are normal. She does not exhibit a depressed mood.          Assessment & Plan:   SHE WILL RETRUN FOR CPX

## 2014-12-17 NOTE — Progress Notes (Signed)
Pre visit review using our clinic review tool, if applicable. No additional management support is needed unless otherwise documented below in the visit note. 

## 2014-12-17 NOTE — Addendum Note (Signed)
Addended by: Carter Kitten on: 12/17/2014 04:21 PM   Modules accepted: Orders

## 2014-12-17 NOTE — Assessment & Plan Note (Signed)
Treat with topical steroid cream.

## 2014-12-17 NOTE — Patient Instructions (Addendum)
Schedule CPX with labs prior in next month.  Decrease alcohol to one drink per day.  Increase exercise. Trial of hemorrhoid cream.

## 2014-12-17 NOTE — Assessment & Plan Note (Signed)
Continue PPI ?

## 2014-12-18 ENCOUNTER — Telehealth: Payer: Self-pay | Admitting: *Deleted

## 2014-12-18 NOTE — Telephone Encounter (Signed)
Received fax from [facility] requesting Prior Authorization for Albuterol Neb Sol.  PA completed via telephone.  It has been sent for clinical review.  We will be notified of decision by fax.

## 2014-12-18 NOTE — Telephone Encounter (Signed)
PA request for Albuterol Neb Solution denied under Medicare Part D.  Per Letter since medication will be used in a nebulizer at home, this medication would be covered under Medicare Part B.  Letter faxed to [facility] to advise them to file this medication under Medicare Part B.

## 2014-12-23 ENCOUNTER — Encounter: Payer: Self-pay | Admitting: Family Medicine

## 2014-12-23 ENCOUNTER — Ambulatory Visit (INDEPENDENT_AMBULATORY_CARE_PROVIDER_SITE_OTHER): Payer: Medicare Other | Admitting: Family Medicine

## 2014-12-23 VITALS — BP 120/70 | HR 71 | Temp 98.2°F | Ht 59.0 in | Wt 188.8 lb

## 2014-12-23 DIAGNOSIS — T1511XA Foreign body in conjunctival sac, right eye, initial encounter: Secondary | ICD-10-CM | POA: Diagnosis not present

## 2014-12-23 DIAGNOSIS — S0501XA Injury of conjunctiva and corneal abrasion without foreign body, right eye, initial encounter: Secondary | ICD-10-CM | POA: Diagnosis not present

## 2014-12-23 DIAGNOSIS — T1501XA Foreign body in cornea, right eye, initial encounter: Secondary | ICD-10-CM | POA: Diagnosis not present

## 2014-12-23 NOTE — Progress Notes (Signed)
Pre visit review using our clinic review tool, if applicable. No additional management support is needed unless otherwise documented below in the visit note. 

## 2014-12-23 NOTE — Progress Notes (Signed)
   Subjective:    Patient ID: Crystal Gonzalez, female    DOB: 03/11/1956, 59 y.o.   MRN: [id number]  HPI  59 year old female presents with new onset watery eye, with flim over it in right eye.. Feel like something is in it, feels like sand in eye. Matted together, but no pus, eye redness. No vision changes. Not itchy. No pain.  She noted this first in right eye, now left eye is bothering her.. She is not sure this is not in her mind.  She was cutting wood on her deck on 12/15/2014 . No wearing eye protection other the eye glasses.  Noted few days after.  She has tried to flush eyes with water.  She has not sick contacts , but was in our office last week.   Review of Systems  Constitutional: Negative for fever and fatigue.  HENT: Negative for ear pain.   Eyes: Negative for pain.  Neurological: Negative for numbness.       Objective:   Physical Exam  Constitutional: She appears well-developed.  HENT:  Head: Normocephalic.  Right Ear: External ear normal.  Left Ear: External ear normal.  Mouth/Throat: Oropharynx is clear and moist. No oropharyngeal exudate.  Eyes: EOM are normal. Pupils are equal, round, and reactive to light. Lids are everted and swept, no foreign bodies found. Right eye exhibits no discharge and no exudate. Left eye exhibits no discharge and no exudate. Right conjunctiva is injected. Left conjunctiva is not injected.  Fluroscein applied to bilateral eyes: small abraision seen in right eye, no clear foreign body but pt reports sensation of foreign body under right eye lid, everted partially, none seen  Neck: Normal range of motion. Neck supple.  Cardiovascular: Normal rate and normal heart sounds.   Pulmonary/Chest: Effort normal and breath sounds normal.  Lymphadenopathy:    She has no cervical adenopathy.           Assessment & Plan:

## 2014-12-23 NOTE — Assessment & Plan Note (Signed)
Treat with saline.  No clear allergies or viral conjunctivitis. Refer to eye MD for full exam to completely rule out foreign body.

## 2014-12-23 NOTE — Patient Instructions (Signed)
Apply saline drops in eye as needed.  Stop at front desk to set up referral to eye MD today or tomorrow.  Wear eye protection  cutting wood!

## 2015-02-11 ENCOUNTER — Other Ambulatory Visit: Payer: Self-pay | Admitting: Family Medicine

## 2015-02-11 ENCOUNTER — Encounter: Payer: Self-pay | Admitting: Family Medicine

## 2015-02-11 ENCOUNTER — Ambulatory Visit (INDEPENDENT_AMBULATORY_CARE_PROVIDER_SITE_OTHER): Payer: Medicare Other | Admitting: Family Medicine

## 2015-02-11 VITALS — BP 130/80 | HR 62 | Temp 98.2°F | Ht 59.25 in | Wt 187.8 lb

## 2015-02-11 DIAGNOSIS — Z1159 Encounter for screening for other viral diseases: Secondary | ICD-10-CM | POA: Diagnosis not present

## 2015-02-11 DIAGNOSIS — E785 Hyperlipidemia, unspecified: Secondary | ICD-10-CM | POA: Diagnosis not present

## 2015-02-11 DIAGNOSIS — R5383 Other fatigue: Secondary | ICD-10-CM | POA: Diagnosis not present

## 2015-02-11 DIAGNOSIS — E559 Vitamin D deficiency, unspecified: Secondary | ICD-10-CM | POA: Diagnosis not present

## 2015-02-11 DIAGNOSIS — Z0000 Encounter for general adult medical examination without abnormal findings: Secondary | ICD-10-CM

## 2015-02-11 MED ORDER — HALOBETASOL PROPIONATE 0.05 % EX CREA: TOPICAL_CREAM | Freq: Two times a day (BID) | CUTANEOUS | Status: DC

## 2015-02-11 NOTE — Addendum Note (Signed)
Addended by: Ellamae Sia on: 02/11/2015 04:50 PM   Modules accepted: Orders

## 2015-02-11 NOTE — Progress Notes (Signed)
Pre visit review using our clinic review tool, if applicable. No additional management support is needed unless otherwise documented below in the visit note. 

## 2015-02-11 NOTE — Addendum Note (Signed)
Addended by: Eliezer Lofts E on: 02/11/2015 04:58 PM   Modules accepted: SmartSet

## 2015-02-11 NOTE — Progress Notes (Signed)
The patient is here for annual wellness exam and preventative care.   BP Readings from Last 3 Encounters:  02/11/15 130/80  12/23/14 120/70  12/17/14 120/60  She has stopped alcohol. She is on treadmill.  Diet: healthy   Wt Readings from Last 3 Encounters:  02/11/15 187 lb 12 oz (85.163 kg)  12/23/14 188 lb 12 oz (85.616 kg)  12/17/14 190 lb (86.183 kg)    Hyperlipidemia: Due for re-eval.. LDL goal <70, due to TIA. She refuses statins or any natural med for chol. Lab Results  Component Value Date   CHOL 204* 09/19/2013   HDL 68.30 09/19/2013   LDLCALC 112* 09/19/2013   TRIG 117.0 09/19/2013   CHOLHDL 3 09/19/2013   No diabetes, nml liver and kidney function.  Limited exercise.   Review of Systems  Constitutional: Negative for fever and  She does have fatigue.  HENT: Negative for ear pain.  Eyes: Negative for pain.  Respiratory: Negative for chest tightness and shortness of breath.  Cardiovascular: Negative for chest pain, palpitations and leg swelling.  Gastrointestinal: Negative for abdominal pain.  Genitourinary: Negative for dysuria.  Musculoskeletal: Positive for arthralgias and back pain.        Objective:   Physical Exam  Constitutional: Vital signs are normal. She appears well-developed and well-nourished. She is cooperative. Non-toxic appearance. She does not appear ill. No distress.  HENT:  Head: Normocephalic.  Right Ear: Hearing, tympanic membrane, external ear and ear canal normal.  Left Ear: Hearing, tympanic membrane, external ear and ear canal normal.  Nose: Nose normal.  Eyes: Conjunctivae, EOM and lids are normal. Pupils are equal, round, and reactive to light. Lids are everted and swept, no foreign bodies found.  Neck: Trachea normal and normal range of motion. Neck supple. Carotid bruit is not present. No mass and no thyromegaly present.  Cardiovascular: Normal rate, regular rhythm, S1 normal, S2 normal, normal heart sounds and intact  distal pulses. Exam reveals no gallop.  No murmur heard. Pulmonary/Chest: Effort normal and breath sounds normal. No respiratory distress. She has no wheezes. She has no rhonchi. She has no rales.  Abdominal: Soft. Normal appearance and bowel sounds are normal. She exhibits no distension, no fluid wave, no abdominal bruit and no mass. There is no hepatosplenomegaly. There is no tenderness. There is no rebound, no guarding and no CVA tenderness. No hernia.  Genitourinary: No breast swelling, tenderness, discharge or bleeding.  Refused pelvic exam today  Lymphadenopathy:   She has no cervical adenopathy.   She has no axillary adenopathy.  Neurological: She is alert. She has normal strength. No cranial nerve deficit or sensory deficit.  Skin: Skin is warm, dry and intact. No rash noted.  Psychiatric: Her speech is normal and behavior is normal. Judgment normal. Her mood appears not anxious. Cognition and memory are normal. She does not exhibit a depressed mood.          Assessment & Plan:  The patient's preventative maintenance and recommended screening tests for an annual wellness exam were reviewed in full today. Brought up to date unless services declined.  Counselled on the importance of diet, exercise, and its role in overall health and mortality. The patient's FH and SH was reviewed, including their home life, tobacco status, and drug and alcohol status.    Last mammogram 7 years ago. Due now. Last pelvic exam:  7 years ago. Due now, she refuses this now, will consider next year. Colonoscopy: 2003 nml, never returned ifob in 2015.  She will return. Vaccines: Due Tdap but she is allergic. Refused flu. Nonsmoker Hep C: will do , brother with hep C and pt had transfusion at birth.  HIV: refused.

## 2015-02-11 NOTE — Patient Instructions (Addendum)
Set up mammogram on your own. Return IFOB cards this year. Stop at lab on way out.

## 2015-02-12 ENCOUNTER — Encounter: Payer: Self-pay | Admitting: *Deleted

## 2015-02-12 LAB — LIPID PANEL
CHOLESTEROL: 191 mg/dL (ref 0–200)
HDL: 60.9 mg/dL (ref >39.00)
LDL CALC: 115 mg/dL — AB (ref 0–99)
NONHDL: 129.69
Total CHOL/HDL Ratio: 3
Triglycerides: 75 mg/dL (ref 0.0–149.0)
VLDL: 15 mg/dL (ref 0.0–40.0)

## 2015-02-12 LAB — COMPREHENSIVE METABOLIC PANEL
ALT: 30 U/L (ref 0–35)
AST: 25 U/L (ref 0–37)
Albumin: 4.5 g/dL (ref 3.5–5.2)
Alkaline Phosphatase: 69 U/L (ref 39–117)
BUN: 20 mg/dL (ref 6–23)
CHLORIDE: 101 meq/L (ref 96–112)
CO2: 28 mEq/L (ref 19–32)
Calcium: 10.5 mg/dL (ref 8.4–10.5)
Creatinine, Ser: 0.81 mg/dL (ref 0.40–1.20)
GFR: 77.12 mL/min (ref >60.00)
GLUCOSE: 85 mg/dL (ref 70–99)
POTASSIUM: 4.5 meq/L (ref 3.5–5.1)
SODIUM: 140 meq/L (ref 135–145)
Total Bilirubin: 0.5 mg/dL (ref 0.2–1.2)
Total Protein: 7.5 g/dL (ref 6.0–8.3)

## 2015-02-12 LAB — VITAMIN B12: VITAMIN B 12: 724 pg/mL (ref 211–911)

## 2015-02-12 LAB — CBC WITH DIFFERENTIAL/PLATELET
Basophils Absolute: 0.1 10*3/uL (ref 0.0–0.1)
Basophils Relative: 0.6 % (ref 0.0–3.0)
EOS PCT: 1.4 % (ref 0.0–5.0)
Eosinophils Absolute: 0.1 10*3/uL (ref 0.0–0.7)
HCT: 45.4 % (ref 36.0–46.0)
Hemoglobin: 15.1 g/dL — ABNORMAL HIGH (ref 12.0–15.0)
LYMPHS ABS: 2 10*3/uL (ref 0.7–4.0)
Lymphocytes Relative: 22.8 % (ref 12.0–46.0)
MCHC: 33.3 g/dL (ref 30.0–36.0)
MCV: 89 fl (ref 78.0–100.0)
MONO ABS: 0.6 10*3/uL (ref 0.1–1.0)
Monocytes Relative: 6.2 % (ref 3.0–12.0)
NEUTROS ABS: 6.2 10*3/uL (ref 1.4–7.7)
NEUTROS PCT: 69 % (ref 43.0–77.0)
PLATELETS: 364 10*3/uL (ref 150.0–400.0)
RBC: 5.11 Mil/uL (ref 3.87–5.11)
RDW: 13.2 % (ref 11.5–15.5)
WBC: 9 10*3/uL (ref 4.0–10.5)

## 2015-02-12 LAB — TSH: TSH: 2.45 u[IU]/mL (ref 0.35–4.50)

## 2015-02-12 LAB — HEPATITIS C ANTIBODY: HCV Ab: NEGATIVE

## 2015-02-12 LAB — VITAMIN D 25 HYDROXY (VIT D DEFICIENCY, FRACTURES): VITD: 37.66 ng/mL (ref 30.00–100.00)

## 2015-02-16 ENCOUNTER — Telehealth: Payer: Self-pay | Admitting: Family Medicine

## 2015-02-16 NOTE — Telephone Encounter (Signed)
Pt called to check on her lab results.  She stated she only received the results of the hep c lab. She would like a call back today

## 2015-02-16 NOTE — Telephone Encounter (Signed)
Please let pot know I am sorry she did not get full results.  Labs are great. LDL at goal <130, good HDl and triglycerides at goal, normal kidney function , normal  liver function and  no diabetes. Nml vitamin levels and nml  Thyroid.

## 2015-02-17 NOTE — Telephone Encounter (Signed)
Crystal Gonzalez notified as instructed by telephone.  Copy of lab results mailed to patient.

## 2015-02-26 ENCOUNTER — Other Ambulatory Visit (INDEPENDENT_AMBULATORY_CARE_PROVIDER_SITE_OTHER): Payer: Medicare Other

## 2015-02-26 ENCOUNTER — Other Ambulatory Visit: Payer: Self-pay | Admitting: Family Medicine

## 2015-02-26 ENCOUNTER — Encounter: Payer: Self-pay | Admitting: *Deleted

## 2015-02-26 DIAGNOSIS — Z1211 Encounter for screening for malignant neoplasm of colon: Secondary | ICD-10-CM | POA: Diagnosis not present

## 2015-02-26 LAB — FECAL OCCULT BLOOD, IMMUNOCHEMICAL: FECAL OCCULT BLD: NEGATIVE

## 2015-05-16 ENCOUNTER — Encounter (HOSPITAL_COMMUNITY): Payer: Self-pay | Admitting: Emergency Medicine

## 2015-05-16 ENCOUNTER — Emergency Department (HOSPITAL_COMMUNITY)
Admission: EM | Admit: 2015-05-16 | Discharge: 2015-05-16 | Disposition: A | Discharge status: Home / Self Care | Payer: Medicare Other | Attending: Emergency Medicine | Admitting: Emergency Medicine

## 2015-05-16 ENCOUNTER — Emergency Department (HOSPITAL_COMMUNITY): Payer: Medicare Other

## 2015-05-16 DIAGNOSIS — W010XXA Fall on same level from slipping, tripping and stumbling without subsequent striking against object, initial encounter: Secondary | ICD-10-CM | POA: Insufficient documentation

## 2015-05-16 DIAGNOSIS — S8991XA Unspecified injury of right lower leg, initial encounter: Secondary | ICD-10-CM | POA: Diagnosis not present

## 2015-05-16 DIAGNOSIS — M79602 Pain in left arm: Secondary | ICD-10-CM | POA: Diagnosis not present

## 2015-05-16 DIAGNOSIS — Z87891 Personal history of nicotine dependence: Secondary | ICD-10-CM | POA: Insufficient documentation

## 2015-05-16 DIAGNOSIS — Y9301 Activity, walking, marching and hiking: Secondary | ICD-10-CM | POA: Insufficient documentation

## 2015-05-16 DIAGNOSIS — Z8719 Personal history of other diseases of the digestive system: Secondary | ICD-10-CM | POA: Diagnosis not present

## 2015-05-16 DIAGNOSIS — J45909 Unspecified asthma, uncomplicated: Secondary | ICD-10-CM | POA: Insufficient documentation

## 2015-05-16 DIAGNOSIS — M79662 Pain in left lower leg: Secondary | ICD-10-CM | POA: Diagnosis not present

## 2015-05-16 DIAGNOSIS — Z7951 Long term (current) use of inhaled steroids: Secondary | ICD-10-CM | POA: Insufficient documentation

## 2015-05-16 DIAGNOSIS — Y998 Other external cause status: Secondary | ICD-10-CM | POA: Insufficient documentation

## 2015-05-16 DIAGNOSIS — S60311A Abrasion of right thumb, initial encounter: Secondary | ICD-10-CM | POA: Diagnosis not present

## 2015-05-16 DIAGNOSIS — Y9389 Activity, other specified: Secondary | ICD-10-CM | POA: Insufficient documentation

## 2015-05-16 DIAGNOSIS — S60312A Abrasion of left thumb, initial encounter: Secondary | ICD-10-CM | POA: Diagnosis not present

## 2015-05-16 DIAGNOSIS — Y9289 Other specified places as the place of occurrence of the external cause: Secondary | ICD-10-CM | POA: Diagnosis not present

## 2015-05-16 DIAGNOSIS — Z79899 Other long term (current) drug therapy: Secondary | ICD-10-CM | POA: Insufficient documentation

## 2015-05-16 DIAGNOSIS — M79642 Pain in left hand: Secondary | ICD-10-CM | POA: Diagnosis not present

## 2015-05-16 DIAGNOSIS — S6991XA Unspecified injury of right wrist, hand and finger(s), initial encounter: Secondary | ICD-10-CM | POA: Insufficient documentation

## 2015-05-16 DIAGNOSIS — S4992XA Unspecified injury of left shoulder and upper arm, initial encounter: Secondary | ICD-10-CM | POA: Insufficient documentation

## 2015-05-16 DIAGNOSIS — Z87440 Personal history of urinary (tract) infections: Secondary | ICD-10-CM | POA: Diagnosis not present

## 2015-05-16 DIAGNOSIS — S83001A Unspecified subluxation of right patella, initial encounter: Secondary | ICD-10-CM | POA: Diagnosis not present

## 2015-05-16 DIAGNOSIS — S6992XA Unspecified injury of left wrist, hand and finger(s), initial encounter: Secondary | ICD-10-CM | POA: Diagnosis not present

## 2015-05-16 DIAGNOSIS — M25422 Effusion, left elbow: Secondary | ICD-10-CM

## 2015-05-16 MED ORDER — ACETAMINOPHEN 325 MG PO TABS
650.0000 mg | ORAL_TABLET | Freq: Once | ORAL | Status: AC
  Administered 2015-05-16: 650 mg via ORAL
  Filled 2015-05-16: qty 2

## 2015-05-16 MED ORDER — OXYCODONE-ACETAMINOPHEN 5-325 MG PO TABS
1.0000 | ORAL_TABLET | Freq: Once | ORAL | Status: DC
  Filled 2015-05-16: qty 1

## 2015-05-16 MED ORDER — HYDROCODONE-ACETAMINOPHEN 5-325 MG PO TABS: ORAL_TABLET | ORAL | Status: DC

## 2015-05-16 NOTE — ED Provider Notes (Signed)
CSN: [id number]     Arrival date & time 05/16/15  1010 History  By signing my name below, I, Crystal Gonzalez, attest that this documentation has been prepared under the direction and in the presence of [employer], PA-C. Electronically Signed: Starleen Gonzalez ED Scribe. 05/16/2015. 12:20 PM.    Chief Complaint  Patient presents with  . Fall  . Arm Injury   Patient is a 59 y.o. female presenting with arm injury. The history is provided by the patient. No language interpreter was used.  Arm Injury  HPI Comments: Crystal Gonzalez is a 59 y.o. right-handed female who presents to the Emergency Department complaining of a mechanical fall that occurred this morning as the patient's left knee buckled while walking down her slanted driveway.  She reports landing on her bilateral knees and outretched hands.  She denies head trauma.  She complains currently of pain in the right knee, bilateral hands, and the entirety of the left arm/shoulder.  She reports the pain in her left arm is the most severe.  Patient reports a hx of 5 left knee surgeries and states her leg frequently gives out.  She denies adominal pain, CP, difficulty ambulating.    Past Medical History  Diagnosis Date  . Asthma   . UTI (lower urinary tract infection)   . Hemorrhoids    Past Surgical History  Procedure Laterality Date  . Myomectomy    . Knee surgery      x 5 surgeries  . Breast biopsy     Family History  Problem Relation Age of Onset  . Stroke Mother   . Hypertension Mother   . Heart disease Mother 73    massive MI  . Hypertension Father   . Heart disease Father   . Prostate cancer Father   . Breast cancer Maternal Aunt   . Cancer Maternal Aunt 40    breast cancer   Social History  Substance Use Topics  . Smoking status: Former Smoker    Types: Cigarettes  . Smokeless tobacco: Never Used  . Alcohol Use: Yes   OB History    No data available     Review of Systems A complete 10 system review of systems  was obtained and all systems are negative except as noted in the HPI and PMH.    Allergies  Iodine; Peanut-containing drug products; Tetanus toxoid; and Prednisone  Home Medications   Prior to Admission medications   Medication Sig Start Date End Date Taking? Authorizing Provider  acetaminophen (TYLENOL) 500 MG tablet Take 750 mg by mouth as needed.    Historical Provider, MD  albuterol (PROVENTIL HFA;VENTOLIN HFA) 108 (90 BASE) MCG/ACT inhaler Inhale 2 puffs into the lungs every 6 (six) hours as needed for wheezing or shortness of breath. 12/17/14   Amy Cletis [place], MD  albuterol (PROVENTIL) (2.5 MG/3ML) 0.083% nebulizer solution Take 3 mLs (2.5 mg total) by nebulization every 6 (six) hours as needed for wheezing or shortness of breath. 12/17/14   Amy Cletis [place], MD  EPINEPHrine 0.3 mg/0.3 mL IJ SOAJ injection Inject 0.3 mLs (0.3 mg total) into the muscle once. 12/17/14   Amy Cletis Athens, MD  Fluticasone-Salmeterol (ADVAIR DISKUS) 250-50 MCG/DOSE AEPB INHALE 1 PUFF INTO THE LUNGS TWICE DAILY 12/17/14   Amy Cletis Athens, MD  halobetasol (ULTRAVATE) 0.05 % cream Apply topically 2 (two) times daily. 02/11/15   Amy Cletis Athens, MD  hydrocortisone (ANUSOL-HC) 2.5 % rectal cream Place 1 application rectally 2 (two) times daily.  12/17/14   Amy Cletis Athens, MD  montelukast (SINGULAIR) 10 MG tablet Take 1 tablet (10 mg total) by mouth at bedtime. 12/17/14   Amy Cletis Athens, MD  pantoprazole (PROTONIX) 40 MG tablet Take 1 tablet (40 mg total) by mouth at bedtime. 12/17/14   Amy Cletis [place], MD  triamcinolone (NASACORT ALLERGY 24HR) 55 MCG/ACT AERO nasal inhaler Place 2 sprays into the nose daily. 07/03/13   Amy E Bedsole, MD   BP 160/69 mmHg  Pulse 72  Temp(Src) 98 F (36.7 C) (Oral)  Resp 20  Ht 4\' 11"  (1.499 m)  Wt 175 lb (79.379 kg)  BMI 35.33 kg/m2  SpO2 96% Physical Exam  Constitutional: She is oriented to person, place, and time. She appears well-developed and well-nourished. No distress.  HENT:  Head:  Normocephalic and atraumatic.  Mouth/Throat: Oropharynx is clear and moist.  No abrasions or contusions.   No hemotympanum, battle signs or raccoon's eyes  No crepitance or tenderness to palpation along the orbital rim.  EOMI intact with no pain or diplopia  No abnormal otorrhea or rhinorrhea. Nasal septum midline.  No intraoral trauma.  Eyes: Conjunctivae and EOM are normal. Pupils are equal, round, and reactive to light.  Neck: Normal range of motion. Neck supple. No tracheal deviation present.  No midline C-spine  tenderness to palpation or step-offs appreciated. Patient has full range of motion without pain.  Grip/Biceps/Tricep strength 5/5 bilaterally, sensation to UE intact bilaterally.    Cardiovascular: Normal rate, regular rhythm and intact distal pulses.   Pulmonary/Chest: Effort normal and breath sounds normal. No respiratory distress. She has no wheezes. She has no rales. She exhibits no tenderness.  No TTP or crepitance  Abdominal: Soft. Bowel sounds are normal. She exhibits no distension and no mass. There is no tenderness. There is no rebound and no guarding.  No TTP  Musculoskeletal: Normal range of motion. She exhibits edema and tenderness.  Patient is diffusely tender to palpation along the left arm, there is focal tenderness along the olecranon process. She has significantly reduced range of motion in the shoulder and elbow. There is no snuffbox tenderness bilaterally. Radial pulses 2+ bilaterally, distal sensation is intact.  Neurological: She is alert and oriented to person, place, and time.  Skin: Skin is warm and dry.  Partial thickness abrasion to left thumb on the dorsal aspect of the distal phalanx. Bleeding is controlled, no gross contamination.  Psychiatric: She has a normal mood and affect. Her behavior is normal.  Nursing note and vitals reviewed.   ED Course  Procedures (including critical care time)  SPLINT APPLICATION Date/Time: [id number]  PM Authorized by: Monico Blitz Consent: Verbal consent obtained. Risks and benefits: risks, benefits and alternatives were discussed Consent given by: patient Splint applied by: orthopedic technician Location details: Left elbow  Splint type: Posterior arm  Supplies used: Ortho-Glass and Ace wrap  Post-procedure: The splinted body part was neurovascularly unchanged following the procedure. Patient tolerance: Patient tolerated the procedure well with no immediate complications.     DIAGNOSTIC STUDIES: Oxygen Saturation is 96% on RA, adequate by my interpretation.    COORDINATION OF CARE:  12:27 PM Discussed treatment plan with patient at bedside.  Patient acknowledges and agrees with plan.    Labs Review Labs Reviewed - No data to display  Imaging Review Dg Elbow Complete Left  05/16/2015  CLINICAL DATA:  Recent fall with left elbow pain, initial encounter EXAM: LEFT ELBOW - COMPLETE 3+ VIEW COMPARISON:  Recent forearm films  from earlier in the same day FINDINGS: Joint effusion is noted with elevation of the anterior fat pad. Spurring at the coronoid process is seen. The previously noted lucency is not as well appreciated on these images although the possibility of a nondisplaced fracture could not be totally excluded. No other focal abnormality is seen. IMPRESSION: Joint effusion. The lucency seen previously is not as well appreciated on these images. An undisplaced fracture could not be totally excluded based on this examination. Electronically Signed   By: Inez Catalina M.D.   On: 05/16/2015 13:39   Dg Forearm Left  05/16/2015  CLINICAL DATA:  Pain following fall EXAM: LEFT FOREARM - 2 VIEW COMPARISON:  None. FINDINGS: Frontal and lateral views were obtained. There is a lucency in the coracoid process proximal ulna region, potentially representing a nondisplaced fracture. No other evidence suggesting potential fracture. No dislocation. No abnormal periosteal reaction. There is  extensive osteoarthritic change in the first carpal -metacarpal and scaphotrapezial joints. IMPRESSION: Subtle lucency in the coracoid process proximal ulnar region. Advise dedicated elbow images to better assess this area. No other evidence of potential fracture. No dislocation. Osteoarthritic change in the scaphotrapezial and first carpal -metacarpal joint regions are noted. Electronically Signed   By: Lowella Grip III M.D.   On: 05/16/2015 11:50   Dg Knee Complete 4 Views Right  05/16/2015  CLINICAL DATA:  Pain following fall EXAM: RIGHT KNEE - COMPLETE 4+ VIEW COMPARISON:  None. FINDINGS: Frontal, lateral, and bilateral oblique views were obtained. There is no fracture or dislocation. There is mild lateral patellar subluxation. No joint effusion. No joint space narrowing appreciable. No erosive change. IMPRESSION: Evidence of a degree of lateral patellar subluxation without frank dislocation. No fracture or joint effusion. No appreciable joint space narrowing. Electronically Signed   By: Lowella Grip III M.D.   On: 05/16/2015 11:48   Dg Humerus Left  05/16/2015  CLINICAL DATA:  Pain following fall EXAM: LEFT HUMERUS - 2+ VIEW COMPARISON:  None. FINDINGS: Frontal and lateral views were obtained. No fracture or dislocation. Joint spaces appear intact. No abnormal periosteal reaction. IMPRESSION: No fracture or dislocation.  No appreciable arthropathy. Electronically Signed   By: Lowella Grip III M.D.   On: 05/16/2015 11:47   Dg Hand Complete Left  05/16/2015  CLINICAL DATA:  Pain following fall EXAM: LEFT HAND - COMPLETE 3+ VIEW COMPARISON:  None. FINDINGS: Frontal, oblique, and lateral views were obtained. There is no demonstrable fracture or dislocation. There is marked osteoarthritic change in the scaphotrapezial and first carpal -metacarpal joints. There is extensive osteoarthritic change in the second and fifth DIP joints. There is milder narrowing in all PIP and DIP joints. No  erosive change or bony destruction. There is a small benign cystic area in the proximal lunate bone. IMPRESSION: Multilevel osteoarthritic change, most marked in the second and fifth DIP as well as in the scaphotrapezial and first carpal -metacarpal joints. No acute fracture or dislocation. Electronically Signed   By: Lowella Grip III M.D.   On: 05/16/2015 11:43   Dg Hand Complete Right  05/16/2015  CLINICAL DATA:  Pain following fall EXAM: RIGHT HAND - COMPLETE 3+ VIEW COMPARISON:  None. FINDINGS: Frontal, oblique, and lateral views were obtained. There is no demonstrable fracture or dislocation. There is extensive osteoarthritic change in the scaphotrapezial and first carpal -metacarpal joints with bony remodeling of the trapezium. There is moderate osteoarthritic change in all DIP joints as well as in the second and third MCP joints.  There is no erosive change or bony destruction. IMPRESSION: Multilevel osteoarthritic change.  No acute fracture or dislocation. Electronically Signed   By: Lowella Grip III M.D.   On: 05/16/2015 13:41   I have personally reviewed and evaluated these images and lab results as part of my medical decision-making.   EKG Interpretation None      MDM   Final diagnoses:  None   Filed Vitals:   05/16/15 1018 05/16/15 1025  BP: 160/69   Pulse: 72   Temp: 98 F (36.7 C)   TempSrc: Oral   Resp: 20   Height:  4\' 11"  (1.499 m)  Weight:  79.379 kg  SpO2: 96%     Medications  acetaminophen (TYLENOL) tablet 650 mg (650 mg Oral Given 05/16/15 1237)    Crystal Gonzalez is 59 y.o. female presenting with pain to left shoulder and arm status post fall. 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 There was no head trauma. She's neurovascularly intact and has a abrasion to the left thumb however she declines tetanus shot and states that  she is very allergic to this. Patient is driving home, Tylenol given for pain control.  Triage initiated left hand, left humerus, right knee and left forearm negative however they do note a subtle lucency on the coracoid process and recommend dedicated elbow images. I will also image the right hand which has a contusion.  Left elbow x-ray shows a large joint effusion, the lucency since previously is not well appreciated. Considering the effusion I will treat for a intra-articular fracture. Patient will be placed in a elbow splint and sling. She follows with the orthopedist Dr. Tamera Punt. Advised her to follow closely with Dr. Tamera Punt for a repeat x-ray in 1 week.  Evaluation does not show pathology that would require ongoing emergent intervention or inpatient treatment. Pt is hemodynamically stable and mentating appropriately. Discussed findings and plan with patient/guardian, who agrees with care plan. All questions answered. Return precautions discussed and outpatient follow up given.   New Prescriptions   HYDROCODONE-ACETAMINOPHEN (NORCO/VICODIN) 5-325 MG TABLET    Take 1-2 tablets by mouth every 6 hours as needed for pain and/or cough.     I personally performed the services described in this documentation, which was scribed in my presence. The recorded information has been reviewed and is accurate.   Monico Blitz, PA-C 05/16/15 [place], PA-C 05/16/15 [place], MD 05/16/15 [phone]

## 2015-05-16 NOTE — ED Notes (Signed)
Patient transported to X-ray 

## 2015-05-16 NOTE — ED Notes (Signed)
Ortho Tech in w/pt. 

## 2015-05-16 NOTE — Discharge Instructions (Signed)
You have a large elbow effusion on the left side. This may mean that you have broken bone however we cannot see that on the x-ray today. You will need a repeat x-ray in 1 week. You can follow with Dr. Tamera Punt on that or he can return to the emergency room. Do not hesitate to return to the emergency room for any new, worsening or concerning symptoms.  Take vicodin for breakthrough pain, do not drink alcohol, drive, care for children or do other critical tasks while taking vicodin.
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
## 2015-05-16 NOTE — ED Notes (Signed)
Pt c/o while walking her left leg gave out causing her to fall. Pt c/o pain to left arm pain and right knee pain, and right pinky pain.

## 2015-05-16 NOTE — Progress Notes (Signed)
Orthopedic Tech Progress Note Patient Details:  Crystal Gonzalez 05/12/1956 [id number]  Ortho Devices Type of Ortho Device: Ace wrap, Arm sling, Long arm splint Ortho Device/Splint Location: lue Ortho Device/Splint Interventions: Application   Therisa Mennella 05/16/2015, 2:41 PM

## 2015-05-16 NOTE — ED Notes (Signed)
Ortho Tech aware per Timmie Foerster, EMT.

## 2015-05-19 DIAGNOSIS — S52045A Nondisplaced fracture of coronoid process of left ulna, initial encounter for closed fracture: Secondary | ICD-10-CM | POA: Diagnosis not present

## 2015-06-09 DIAGNOSIS — S52045D Nondisplaced fracture of coronoid process of left ulna, subsequent encounter for closed fracture with routine healing: Secondary | ICD-10-CM | POA: Diagnosis not present

## 2015-06-30 DIAGNOSIS — S52045D Nondisplaced fracture of coronoid process of left ulna, subsequent encounter for closed fracture with routine healing: Secondary | ICD-10-CM | POA: Diagnosis not present

## 2015-09-21 ENCOUNTER — Ambulatory Visit (INDEPENDENT_AMBULATORY_CARE_PROVIDER_SITE_OTHER): Payer: Medicare Other | Admitting: Family Medicine

## 2015-09-21 ENCOUNTER — Encounter: Payer: Self-pay | Admitting: Family Medicine

## 2015-09-21 VITALS — BP 160/80 | HR 72 | Temp 98.0°F | Wt 194.4 lb

## 2015-09-21 DIAGNOSIS — W57XXXA Bitten or stung by nonvenomous insect and other nonvenomous arthropods, initial encounter: Secondary | ICD-10-CM

## 2015-09-21 DIAGNOSIS — S30861A Insect bite (nonvenomous) of abdominal wall, initial encounter: Secondary | ICD-10-CM | POA: Diagnosis not present

## 2015-09-21 DIAGNOSIS — L538 Other specified erythematous conditions: Secondary | ICD-10-CM | POA: Diagnosis not present

## 2015-09-21 DIAGNOSIS — R030 Elevated blood-pressure reading, without diagnosis of hypertension: Secondary | ICD-10-CM

## 2015-09-21 MED ORDER — DOXYCYCLINE HYCLATE 100 MG PO TABS: 100.0000 mg | ORAL_TABLET | Freq: Two times a day (BID) | ORAL | Status: DC

## 2015-09-21 NOTE — Patient Instructions (Addendum)
A few things to remember from today's visit:   1. Tick bite of abdomen, initial encounter   2. Macular erythematous rash I am treating as lyme rash. Lab in a week. Follow as needed.    Check blood pressure at home.   Lyme Disease Lyme disease is an infection that affects many parts of the body, including the skin, joints, and nervous system. CAUSES Lyme disease is caused by bacteria called Borrelia burgdorferi. You can get Lyme disease by being bitten by an infected tick. The tick must be attached to your skin for at least 36 hours to transmit the infection. Deer often carry infected ticks. RISK FACTORS  Living in or visiting [place] states, or the [place].  Spending time in wooded or grassy areas.  Being outdoors with exposed skin.  Failing to remove a tick from your skin within 3-4 days. SIGNS AND SYMPTOMS  A round, red rash that comes out from the center of the tick bite. This is the first sign of infection. The center of the rash may be blood colored or have tiny blisters.  Fatigue.  Headache.  Chills and fever.  General achiness.  Joint pain, often in the knee.  Swollen lymph glands. DIAGNOSIS Lyme disease is diagnosed with a medical history, physical exam, and blood test. TREATMENT The main treatment is antibiotic medicine, usually taken by mouth. The length of treatment depends on how soon after a tick bite you begin taking the medicine. In some cases, treatment is necessary for several weeks. If the infection is severe, IV antibiotics may be necessary. HOME CARE INSTRUCTIONS  Take your antibiotic medicine as directed by your health care provider. Finish the antibiotic even if you start to feel better.  You may take a probiotic in between doses of your antibiotic to help avoid stomach upset or diarrhea.  Check with your health care provider before supplementing your treatment. Many alternative therapies have not been proven and  may be harmful to you.  Keep all follow-up visits as directed by your health care provider. This is important. PREVENTION Reinfection is possible with another tick bite by an infected tick. Take these precautions to prevent an infection:  Cover your skin with light-colored clothing when outdoors in the spring and summer months.  Spray clothing and skin with bug spray. The spray should be 20-30% DEET.  Avoided wooded, grassy, and shaded areas.  Remove yard litter, brush, trash, and plants that attract deer and rodents.  Check yourself for ticks when you come indoors.  Wash clothing worn each day.  Check your pets for ticks before they come inside.  If you find a tick:  Remove it with tweezers.  Clean your hands and the bite area with rubbing alcohol or soap and water. Pregnant women should take special care to avoid tick bites because the infection can be passed along to the fetus. SEEK MEDICAL CARE IF:  You have symptoms after treatment.  You have removed a tick and want to bring it to your health care provider for testing. SEEK IMMEDIATE MEDICAL CARE IF:  You have an irregular heartbeat.  You have nerve pain.  Your face feels numb. MAKE SURE YOU:  Understand these instructions.  Will watch your condition.  Will get help right away if you are not doing well or get worse.   This information is not intended to replace advice given to you by your health care provider. Make sure you discuss any questions you have with  your health care provider.   Document Released: 08/28/2000 Document Revised: 06/12/2014 Document Reviewed: 10/07/2013 Elsevier Interactive Patient Education [employer].

## 2015-09-21 NOTE — Progress Notes (Signed)
Pre visit review using our clinic review tool, if applicable. No additional management support is needed unless otherwise documented below in the visit note. 

## 2015-09-21 NOTE — Progress Notes (Signed)
Subjective:    Patient ID: Crystal Gonzalez, female    DOB: 03-23-1956, 60 y.o.   MRN: [id number]  HPI  Crystal Gonzalez is a 60 y.o.female here today complaining of 2 days of erythematous rash, she noted initially on LUQ and last night on RUQ. She thinks lesions are causued by tick bite, she felt a "bite" while working on her yard 2 days ago, she did not check but later that night she looked at area and noted rash. Noted RUQ new rash last night.  She denies any new medication, detergent, soap, or body product. No outdoor exposures to plants. She has had insect bites on arms, pruritic lesions, which she reports as normal for her, she is "allergic" to mosquito bites and has similar lesions during this time of year.  No sick contact. No Hx of eczema or similar rash in the past. She has not tried OTC medication.  She denies oral lesions/edema,cough, wheezing, dyspnea, abdominal pain, nausea, or vomiting. No associated arthralgia, she has hx of joint pain and some mild deformities appreciated on hands.  BP elevated today, no prior Hx of HTN, she doe snot check BP at home.FHx positive, parents with HTN.  Review of Systems  Constitutional: Negative for fever, chills, appetite change and fatigue.  HENT: Negative for congestion, ear pain, mouth sores, sneezing, sore throat, trouble swallowing and voice change.   Eyes: Negative for redness and itching.  Respiratory: Negative for cough, chest tightness, shortness of breath and wheezing.   Cardiovascular: Negative for chest pain, palpitations and leg swelling.  Gastrointestinal: Negative for nausea, vomiting, abdominal pain and diarrhea.  Musculoskeletal: Negative for myalgias and back pain.       No associated arthralgias or joint edema/erythema   Skin: Positive for rash.  Allergic/Immunologic: Negative for environmental allergies and food allergies.  Neurological: Negative for tremors, weakness, numbness and headaches.     Current  Outpatient Prescriptions on File Prior to Visit  Medication Sig Dispense Refill  . acetaminophen (TYLENOL) 500 MG tablet Take 750 mg by mouth as needed.    Marland Kitchen albuterol (PROVENTIL HFA;VENTOLIN HFA) 108 (90 BASE) MCG/ACT inhaler Inhale 2 puffs into the lungs every 6 (six) hours as needed for wheezing or shortness of breath. 1 Inhaler 0  . albuterol (PROVENTIL) (2.5 MG/3ML) 0.083% nebulizer solution Take 3 mLs (2.5 mg total) by nebulization every 6 (six) hours as needed for wheezing or shortness of breath. 150 mL 1  . EPINEPHrine 0.3 mg/0.3 mL IJ SOAJ injection Inject 0.3 mLs (0.3 mg total) into the muscle once. 1 Device 0  . Fluticasone-Salmeterol (ADVAIR DISKUS) 250-50 MCG/DOSE AEPB INHALE 1 PUFF INTO THE LUNGS TWICE DAILY 180 each 3  . halobetasol (ULTRAVATE) 0.05 % cream Apply topically 2 (two) times daily. 50 g 0  . HYDROcodone-acetaminophen (NORCO/VICODIN) 5-325 MG tablet Take 1-2 tablets by mouth every 6 hours as needed for pain and/or cough. 19 tablet 0  . hydrocortisone (ANUSOL-HC) 2.5 % rectal cream Place 1 application rectally 2 (two) times daily. 30 g 0  . montelukast (SINGULAIR) 10 MG tablet Take 1 tablet (10 mg total) by mouth at bedtime. 90 tablet 3  . pantoprazole (PROTONIX) 40 MG tablet Take 1 tablet (40 mg total) by mouth at bedtime. (Patient taking differently: Take 20 mg by mouth at bedtime. ) 90 tablet 3  . triamcinolone (NASACORT ALLERGY 24HR) 55 MCG/ACT AERO nasal inhaler Place 2 sprays into the nose daily. 3 Inhaler 1   No current facility-administered medications on file  prior to visit.     Past Medical History  Diagnosis Date  . Asthma   . UTI (lower urinary tract infection)   . Hemorrhoids     Social History   Social History  . Marital Status: Widowed    Spouse Name: N/A  . Number of Children: N/A  . Years of Education: N/A   Social History Main Topics  . Smoking status: Former Smoker    Types: Cigarettes  . Smokeless tobacco: Never Used  . Alcohol Use:  Yes  . Drug Use: No  . Sexual Activity: Not Asked   Other Topics Concern  . None   Social History Narrative    Filed Vitals:   09/21/15 1502  BP: 160/80  Pulse: 72  Temp: 98 F (36.7 C)   Body mass index is 39.24 kg/(m^2).      Objective:   Physical Exam  Constitutional: She is oriented to person, place, and time. She appears well-developed. No distress.  HENT:  Head: Atraumatic.  Mouth/Throat: Oropharynx is clear and moist. No oropharyngeal exudate.  Eyes: Conjunctivae and EOM are normal. Pupils are equal, round, and reactive to light.  Cardiovascular: Normal rate and regular rhythm.   No murmur heard. Pulmonary/Chest: Effort normal and breath sounds normal. No respiratory distress. She has no wheezes. She has no rales.  Abdominal: There is no tenderness.  Musculoskeletal: She exhibits no edema or tenderness.  Lymphadenopathy:    She has no cervical adenopathy.  Neurological: She is alert and oriented to person, place, and time. No cranial nerve deficit. Coordination normal.  Stable gait, limping (Hx of LLE pain/brace on)  Skin: Rash noted. There is erythema.     Macular, erythematous rash on RUQ and LUQ, L>R, no tender, no indurated. + Local heat, no clear center, there is a punctuate crust in center of each one, no fluctuant. About 3 cm left, 2.5 cm right.  She has other erythematous papular lesions scattered on arms, no tender, no local heat, no induration.  Psychiatric: She has a normal mood and affect.       Assessment & Plan:    Crystal Gonzalez was seen today for rash.  Diagnoses and all orders for this visit:  Tick bite of abdomen, initial encounter  She did not see tick and today I do not appreciate tick body parts on rash areas but certainly rash is concerning for tick bite. Monitor for new symptoms.         -     doxycycline (VIBRA-TABS) 100 MG tablet; Take 1 tablet (100 mg total) by mouth 2 (two) times daily. -     Lyme Disease Abs IgG, IgM, IFA, CSF;  Future  Macular erythematous rash ? Erythema migrans.  I am treating with Doxycycline x 10 days initially. At this time Lyme ab could be still false negative, so recommended in a few days, before she completes abx treatment.   -     Lyme Disease Abs IgG, IgM, IFA, CSF; Future  Elevated blood pressure (not hypertension)  Re-checked 150/80.  Recommended checking BP at home.      Betty G. Martinique, MD  [facility]. [place] office.

## 2015-09-28 ENCOUNTER — Other Ambulatory Visit: Payer: Self-pay | Admitting: Family Medicine

## 2015-09-28 ENCOUNTER — Other Ambulatory Visit (INDEPENDENT_AMBULATORY_CARE_PROVIDER_SITE_OTHER): Payer: Medicare Other

## 2015-09-28 DIAGNOSIS — S30861A Insect bite (nonvenomous) of abdominal wall, initial encounter: Secondary | ICD-10-CM

## 2015-09-28 DIAGNOSIS — W57XXXA Bitten or stung by nonvenomous insect and other nonvenomous arthropods, initial encounter: Secondary | ICD-10-CM | POA: Diagnosis not present

## 2015-09-28 DIAGNOSIS — L538 Other specified erythematous conditions: Secondary | ICD-10-CM

## 2015-09-29 LAB — LYME AB/WESTERN BLOT REFLEX: B burgdorferi Ab IgG+IgM: <0.9 Index (ref <0.90)

## 2015-10-01 ENCOUNTER — Telehealth: Payer: Self-pay

## 2015-10-01 MED ORDER — OMEPRAZOLE 20 MG PO CPDR
20.0000 mg | DELAYED_RELEASE_CAPSULE | Freq: Every day | ORAL | Status: DC
Start: 2015-10-01 — End: 2015-10-06

## 2015-10-01 NOTE — Telephone Encounter (Signed)
Okay to send in pantoprazole 20 mg daily #30, 5 RF . Pt can try lower dose then try to wean off over 1 month going own to every other day, every 2 days then off.

## 2015-10-01 NOTE — Telephone Encounter (Signed)
Pt left v/m wanting to decrease pantoprazole to 20 mg; pt has been on medication for over 30 years; pt understands that pantoprazole can cause kidney problems ( like kidneys shutting down). Pt request new rx to [facility].pt request cb.

## 2015-10-01 NOTE — Telephone Encounter (Signed)
Ms. Rockwell notified as instructed by telephone.  Prilosec 20 mg prescription sent into [facility] [address] as instructed by Dr. Diona Browner.

## 2015-10-06 MED ORDER — PANTOPRAZOLE SODIUM 20 MG PO TBEC: 20.0000 mg | DELAYED_RELEASE_TABLET | Freq: Every day | ORAL | Status: DC

## 2015-10-06 NOTE — Telephone Encounter (Addendum)
Pt upset that she did not get a 90 day supply; pt did not pick up rx at [place]. I told pt I could change to # 90 x 1.pt voiced understanding. I [facility] with [facility] at [facility] and she changed to Pantoprazole 20 mg taking one daily. # 90 x 1. Alyse Low will D/C omeprazole rx. Gina RN team lead said no safety portal needed since pt did not pick up rx.

## 2015-10-06 NOTE — Addendum Note (Signed)
Addended by: Helene Shoe on: 10/06/2015 03:05 PM   Modules accepted: Orders, Medications

## 2015-10-23 ENCOUNTER — Ambulatory Visit (HOSPITAL_COMMUNITY)
Admission: EM | Admit: 2015-10-23 | Discharge: 2015-10-23 | Disposition: A | Discharge status: Home / Self Care | Payer: Medicare Other | Attending: Emergency Medicine | Admitting: Emergency Medicine

## 2015-10-23 ENCOUNTER — Encounter (HOSPITAL_COMMUNITY): Payer: Self-pay | Admitting: Emergency Medicine

## 2015-10-23 DIAGNOSIS — W57XXXA Bitten or stung by nonvenomous insect and other nonvenomous arthropods, initial encounter: Secondary | ICD-10-CM

## 2015-10-23 DIAGNOSIS — S20161A Insect bite (nonvenomous) of breast, right breast, initial encounter: Secondary | ICD-10-CM

## 2015-10-23 NOTE — Discharge Instructions (Signed)
Tick Bite Information Ticks are insects that attach themselves to the skin and draw blood for food. There are various types of ticks. Common types include wood ticks and deer ticks. Most ticks live in shrubs and grassy areas. Ticks can climb onto your body when you make contact with leaves or grass where the tick is waiting. The most common places on the body for ticks to attach themselves are the scalp, neck, armpits, waist, and groin. Most tick bites are harmless, but sometimes ticks carry germs that cause diseases. These germs can be spread to a person during the tick's feeding process. The chance of a disease spreading through a tick bite depends on:   The type of tick.  Time of year.   How long the tick is attached.   Geographic location.  HOW CAN YOU PREVENT TICK BITES? Take these steps to help prevent tick bites when you are outdoors:  Wear protective clothing. Long sleeves and long pants are best.   Wear [place] clothes so you can see ticks more easily.  Tuck your pant legs into your socks.   If walking on a trail, stay in the middle of the trail to avoid brushing against bushes.  Avoid walking through areas with long grass.  Put insect repellent on all exposed skin and along boot tops, pant legs, and sleeve cuffs.   Check clothing, hair, and skin repeatedly and before going inside.   Brush off any ticks that are not attached.  Take a shower or bath as soon as possible after being outdoors.  WHAT IS THE PROPER WAY TO REMOVE A TICK? Ticks should be removed as soon as possible to help prevent diseases caused by tick bites. 1. If latex gloves are available, put them on before trying to remove a tick.  2. Using fine-point tweezers, grasp the tick as close to the skin as possible. You may also use curved forceps or a tick removal tool. Grasp the tick as close to its head as possible. Avoid grasping the tick on its body. 3. Pull gently with steady upward pressure until  the tick lets go. Do not twist the tick or jerk it suddenly. This may break off the tick's head or mouth parts. 4. Do not squeeze or crush the tick's body. This could force disease-carrying fluids from the tick into your body.  5. After the tick is removed, wash the bite area and your hands with soap and water or other disinfectant such as alcohol. 6. Apply a small amount of antiseptic cream or ointment to the bite site.  7. Wash and disinfect any instruments that were used.  Do not try to remove a tick by applying a hot match, petroleum jelly, or fingernail polish to the tick. These methods do not work and may increase the chances of disease being spread from the tick bite.  WHEN SHOULD YOU SEEK MEDICAL CARE? Contact your health care provider if you are unable to remove a tick from your skin or if a part of the tick breaks off and is stuck in the skin.  After a tick bite, you need to be aware of signs and symptoms that could be related to diseases spread by ticks. Contact your health care provider if you develop any of the following in the days or weeks after the tick bite:  Unexplained fever.  Rash. A circular rash that appears days or weeks after the tick bite may indicate the possibility of Lyme disease. The rash may resemble  a target with a bull's-eye and may occur at a different part of your body than the tick bite.  Redness and swelling in the area of the tick bite. An area of light redness that usually blanches and is located around the area of the tick bite and occurs quickly is usually a localized allergic reaction. Itching often occurs in this area. Applying cold compresses can help with itching.  Tender, swollen lymph glands.   Diarrhea.   Weight loss.   Cough.   Fatigue.   Muscle, joint, or bone pain.   Abdominal pain.   Headache.   Lethargy or a change in your level of consciousness.  Difficulty walking or moving your legs.   Numbness in the legs.    Paralysis.  Shortness of breath.   Confusion.   Repeated vomiting.    This information is not intended to replace advice given to you by your health care provider. Make sure you discuss any questions you have with your health care provider.   Document Released: 05/19/2000 Document Revised: 06/12/2014 Document Reviewed: 10/30/2012 Elsevier Interactive Patient Education [employer].

## 2015-10-23 NOTE — ED Provider Notes (Signed)
CSN: [id number]     Arrival date & time 10/23/15  1510 History   First MD Initiated Contact with Patient 10/23/15 1632     Chief Complaint  Patient presents with  . Tick Removal   (Consider location/radiation/quality/duration/timing/severity/associated sxs/prior Treatment) HPI Comments: 60 year old female states that she found a tick on her right upper breast this morning and pulled it off. A few hours later she noticed there was a blotchy red area surrounding the tick bite. She states she is here to get antibiotics. She had 2 tick bites one to the side of her abdomen a few weeks ago, went to her physician who promptly gave her antibiotics for the tick bites. She states the tick was not engorged. It had been attached for less than 24 hours and she is having no systemic symptoms. She denies itching but states the area feels warm.   Past Medical History  Diagnosis Date  . Asthma   . UTI (lower urinary tract infection)   . Hemorrhoids    Past Surgical History  Procedure Laterality Date  . Myomectomy    . Knee surgery      x 5 surgeries  . Breast biopsy     Family History  Problem Relation Age of Onset  . Stroke Mother   . Hypertension Mother   . Heart disease Mother 98    massive MI  . Hypertension Father   . Heart disease Father   . Prostate cancer Father   . Breast cancer Maternal Aunt   . Cancer Maternal Aunt 40    breast cancer   Social History  Substance Use Topics  . Smoking status: Former Smoker    Types: Cigarettes  . Smokeless tobacco: Never Used  . Alcohol Use: Yes   OB History    No data available     Review of Systems  Constitutional: Negative for fever, diaphoresis, activity change and fatigue.  HENT: Negative.   Respiratory: Negative.   Cardiovascular: Negative.   Gastrointestinal: Negative.   Musculoskeletal: Negative.   Skin: Negative for rash.  Neurological: Negative.   All other systems reviewed and are negative.   Allergies  Iodine;  Peanut-containing drug products; Tetanus toxoid; and Prednisone  Home Medications   Prior to Admission medications   Medication Sig Start Date End Date Taking? Authorizing Provider  albuterol (PROVENTIL HFA;VENTOLIN HFA) 108 (90 BASE) MCG/ACT inhaler Inhale 2 puffs into the lungs every 6 (six) hours as needed for wheezing or shortness of breath. 12/17/14  Yes Amy E Bedsole, MD  montelukast (SINGULAIR) 10 MG tablet Take 1 tablet (10 mg total) by mouth at bedtime. 12/17/14  Yes Amy Cletis [place], MD  pantoprazole (PROTONIX) 20 MG tablet Take 1 tablet (20 mg total) by mouth daily. 10/06/15  Yes Amy Cletis [place], MD  acetaminophen (TYLENOL) 500 MG tablet Take 750 mg by mouth as needed.    Historical Provider, MD  albuterol (PROVENTIL) (2.5 MG/3ML) 0.083% nebulizer solution Take 3 mLs (2.5 mg total) by nebulization every 6 (six) hours as needed for wheezing or shortness of breath. 12/17/14   Amy Cletis Athens, MD  doxycycline (VIBRA-TABS) 100 MG tablet Take 1 tablet (100 mg total) by mouth 2 (two) times daily. 09/21/15   Betty G Martinique, MD  EPINEPHrine 0.3 mg/0.3 mL IJ SOAJ injection Inject 0.3 mLs (0.3 mg total) into the muscle once. 12/17/14   Amy Cletis Athens, MD  Fluticasone-Salmeterol (ADVAIR DISKUS) 250-50 MCG/DOSE AEPB INHALE 1 PUFF INTO THE LUNGS TWICE DAILY 12/17/14  Amy Cletis Athens, MD  halobetasol (ULTRAVATE) 0.05 % cream Apply topically 2 (two) times daily. 02/11/15   Amy Cletis [place], MD  HYDROcodone-acetaminophen (NORCO/VICODIN) 5-325 MG tablet Take 1-2 tablets by mouth every 6 hours as needed for pain and/or cough. 05/16/15   Nicole Pisciotta, PA-C  hydrocortisone (ANUSOL-HC) 2.5 % rectal cream Place 1 application rectally 2 (two) times daily. 12/17/14   Amy Cletis [place], MD  triamcinolone (NASACORT ALLERGY 24HR) 55 MCG/ACT AERO nasal inhaler Place 2 sprays into the nose daily. 07/03/13   Amy Cletis [place], MD   Meds Ordered and Administered this Visit  Medications - No data to display  BP 153/78 mmHg  Pulse 69   Temp(Src) 97 F (36.1 C) (Oral)  Resp 16  SpO2 96% No data found.   Physical Exam  Constitutional: She is oriented to person, place, and time. She appears well-developed and well-nourished. No distress.  HENT:  Mouth/Throat: Oropharynx is clear and moist.  Eyes: Conjunctivae and EOM are normal.  Neck: Normal range of motion. Neck supple.  Cardiovascular: Normal rate.   Pulmonary/Chest: Effort normal. No respiratory distress.  Musculoskeletal: She exhibits no edema.  Neurological: She is alert and oriented to person, place, and time. She exhibits normal muscle tone.  Skin: Skin is warm and dry.  As per history of present illness there is a lesion to the right anterior chest at the superior aspect of the right breast. There is a central mark which was a result of the patient was living the tick. Under magnification noticed no sign of mandible or other parts remaining. There is a cutaneous, red blotchy area extending approximately 2-1/2-3 cm around the central lesion. It blanches with light touch. Does not palpate as hot or increased warmth to the examiner. No tenderness. No signs of infection. No lymphangitis.  Psychiatric: She has a normal mood and affect.  Nursing note and vitals reviewed.   ED Course  Procedures (including critical care time)  Labs Review Labs Reviewed - No data to display  Imaging Review No results found.   Visual Acuity Review  Right Eye Distance:   Left Eye Distance:   Bilateral Distance:    Right Eye Near:   Left Eye Near:    Bilateral Near:         MDM   1. Tick bite of female breast, right, initial encounter    Tick bite information along with tick borne illness symptoms are given to the patient. She is experiencing none of these. Low probability of developing a tickborne illness with this particular bite. No local or systemic signs of infection. The patient is allergic to most topical medications including Benadryl, corticosteroids,  hydrogen peroxide and several other medications. She is instructed to apply cold compresses 4 any itching and redness. Follow-up with your PCP as needed. Signs and symptoms of tickborne illness as discussed and in her written instruction she is instructed to seek medical attention immediately.    Janne Napoleon, NP 10/23/15 1715

## 2015-10-23 NOTE — ED Notes (Signed)
Reports she noticed a tick on right side of chest; she removed tick Has redness and localized fever at site  A&O x4... No acute distress.

## 2015-10-25 ENCOUNTER — Telehealth: Payer: Self-pay

## 2015-10-25 NOTE — Telephone Encounter (Signed)
Per chart review tab pt was seen [facility] on 10/23/15.

## 2015-10-25 NOTE — Telephone Encounter (Signed)
PLEASE NOTE: All timestamps contained within this report are represented as [place] Standard Time. CONFIDENTIALTY NOTICE: This fax transmission is intended only for the addressee. It contains information that is legally privileged, confidential or otherwise protected from use or disclosure. If you are not the intended recipient, you are strictly prohibited from reviewing, disclosing, copying using or disseminating any of this information or taking any action in reliance on or regarding this information. If you have received this fax in error, please notify us immediately by telephone so that we can arrange for its return to [place]. Phone: [phone], Toll-Free: [phone], Fax: [phone] Page: 1 of 3 Call Id: [id number] [place] Patient Name: Crystal Gonzalez Gender: Female DOB: 1956/06/03 Age: 60 Y 11 M 8 D Return Phone Number: [phone] (Primary), [phone] (Secondary) Address: City/State/Zip: [place] [address] Client [facility] - Client Client Site [place] - Day Physician Eliezer Lofts - MD Contact Type Call Who Is Calling Patient / Member / Family / Caregiver Call Type Triage / Clinical Relationship To Patient Self Return Phone Number [phone] (Secondary) Chief Complaint Tick Bite Reason for Call Symptomatic / Request for [facility] states, was bit by a tick last night, she has a round circle at the bite mark. She was placed on antibiotics the last time. She wants the same Rx [facility]. [facility] [phone] Appointment Disposition EMR Patient Refused Appointment Info pasted into Epic No PreDisposition Home Care Translation No Nurse Assessment Nurse: Raphael Gibney, RN, Vanita Ingles Date/Time (Eastern Time): 10/23/2015 11:08:05 AM Please select the assessment type ---Verbal order / New medication  order Additional Documentation ---Caller wants she does not want to go to the [facility] clinic or urgent and wants antibiotics. The same antibiotics she had before. Does the client directives allow for assistance with medications after hours? ---Yes Other current medications? ---Yes List current medications. ---advair; singular; Pantoprazole; nasacort; Medication allergies? ---Yes List medication allergies. ---bacitracin oint; antibiotic oint; iodine; Pharmacy name and phone number. [facility] [address] phone: [phone] Does the client directive allow for RN to call in the medication order to the pharmacy? ---Yes Additional Documentation ---Advised caller that on call physician would be paged for medication. Verbalized understanding. Nurse: Raphael Gibney, RN, Vera Date/Time (Eastern Time): 10/23/2015 10:59:17 AM Confirm and document reason for call. If symptomatic, describe symptoms. You must click the next button to save text entered. ---Caller states she was bitten by a tick last night on the top of her right breast. Has a round circle around the bite. Got all the tick out. She was started on antibiotics the last time she was bitten. PLEASE NOTE: All timestamps contained within this report are represented as [place] Standard Time. CONFIDENTIALTY NOTICE: This fax transmission is intended only for the addressee. It contains information that is legally privileged, confidential or otherwise protected from use or disclosure. If you are not the intended recipient, you are strictly prohibited from reviewing, disclosing, copying using or disseminating any of this information or taking any action in reliance on or regarding this information. If you have received this fax in error, please notify us immediately by telephone so that we can arrange for its return to [place]. Phone: [phone], Toll-Free: [phone], Fax: [phone] Page: 2 of 3 Call Id: [id number] Nurse Assessment Does not  know what antibiotics she was on. No fever. Area is itchy. Has the patient traveled out of the country  within the last 30 days? ---Not Applicable Does the patient have any new or worsening symptoms? ---Yes Will a triage be completed? ---Yes Related visit to physician within the last 2 weeks? ---No Does the PT have any chronic conditions? (i.e. diabetes, asthma, etc.) ---Yes List chronic conditions. ---asthma; ACL on left knee Is this a behavioral health or substance abuse call? ---No Guidelines Guideline Title Affirmed Question Affirmed Notes Nurse Date/Time (Eastern Time) Tick Bite Red ring or bull's-eye rash occurs at tick bite Raphael Gibney, RN, Vera 10/23/2015 11:03:56 AM Disp. Time Eilene Ghazi Time) Disposition Final User 10/23/2015 11:15:52 AM Called On-Call Provider Raphael Gibney, RN, Vanita Ingles 10/23/2015 11:18:59 AM See Physician within 24 Hours Yes Raphael Gibney, RN, Doreatha Lew Understands: Yes Disagree/Comply: Disagree Disagree/Comply Reason: Disagree with instructions Care Advice Given Per Guideline SEE PHYSICIAN WITHIN 24 HOURS: * IF OFFICE WILL BE OPEN: You need to be seen within the next 24 hours. Call your doctor when the office opens, and make an appointment. CARE ADVICE given per Tick Bites (Adult) guideline. CALL BACK IF: * Fever occurs * You become worse. Referrals GO TO FACILITY REFUSED Paging DoctorName Phone DateTime Result/Outcome Message Type Notes Thersa Salt - DO [id number] 10/23/2015 11:15:52 AM Called On Call Provider - Reached Doctor Paged Thersa Salt - DO 10/23/2015 11:18:41 AM Spoke with On Call - General Message Result Called Dr. Michael Boston cook on his cell phone and gave report that pt was bitten by a tick yesterday and has a red ring around the area and is wanting antibiotics called in. States that he does not call in PLEASE NOTE: All timestamps contained within this report are represented as [place] Standard Time. CONFIDENTIALTY NOTICE: This fax transmission  is intended only for the addressee. It contains information that is legally privileged, confidential or otherwise protected from use or disclosure. If you are not the intended recipient, you are strictly prohibited from reviewing, disclosing, copying using or disseminating any of this information or taking any action in reliance on or regarding this information. If you have received this fax in error, please notify us immediately by telephone so that we can arrange for its return to [place]. Phone: [phone], Toll-Free: [phone], Fax: [phone] Page: 3 of 3 Call Id: [id number] [place] Phone DateTime Result/Outcome Message Type Notes antibiotics over the phone and pt needs to be seen to have area evaluated. Pt notified and verbalized understanding.

## 2015-10-26 ENCOUNTER — Telehealth: Payer: Self-pay | Admitting: Family Medicine

## 2015-10-26 NOTE — Telephone Encounter (Signed)
I will see her then  

## 2015-10-26 NOTE — Telephone Encounter (Signed)
Patient Name: Crystal Gonzalez  DOB: 1955/09/18    Initial Comment caller states she was bitten by a tick   Nurse Assessment  Nurse: Julien Girt, RN, Almyra Free Date/Time Eilene Ghazi Time): 10/26/2015 11:31:16 AM  Confirm and document reason for call. If symptomatic, describe symptoms. You must click the next button to save text entered. ---Caller states she was bitten by a tick on her right breast. She cleansed the site an was seen at an UC but was not given any ABX an she is concerned. She did have a bulls eye around the site and today she has a red streak going towards her arm pit. No fever, states she feels fine.  Has the patient traveled out of the country within the last 30 days? ---Not Applicable  Does the patient have any new or worsening symptoms? ---Yes  Will a triage be completed? ---Yes  Related visit to physician within the last 2 weeks? ---Yes  Does the PT have any chronic conditions? (i.e. diabetes, asthma, etc.) ---Yes  List chronic conditions. ---Asthma, GERD, Allergies  Is this a behavioral health or substance abuse call? ---No     Guidelines    Guideline Title Affirmed Question Affirmed Notes  Tick Bite Red ring or bull's-eye rash occurs at tick bite    Final Disposition User   See Physician within [place], RN, Almyra Free    Referrals  REFERRED TO PCP OFFICE   Disagree/Comply: Leta Baptist

## 2015-10-26 NOTE — Telephone Encounter (Signed)
Pt has appt 10/27/15 at 10 AM with Dr Glori Bickers.

## 2015-10-27 ENCOUNTER — Ambulatory Visit (INDEPENDENT_AMBULATORY_CARE_PROVIDER_SITE_OTHER): Payer: Medicare Other | Admitting: Family Medicine

## 2015-10-27 ENCOUNTER — Encounter: Payer: Self-pay | Admitting: Family Medicine

## 2015-10-27 VITALS — BP 122/62 | HR 72 | Temp 97.7°F | Ht 59.25 in | Wt 195.2 lb

## 2015-10-27 DIAGNOSIS — T148 Other injury of unspecified body region: Secondary | ICD-10-CM

## 2015-10-27 DIAGNOSIS — W57XXXA Bitten or stung by nonvenomous insect and other nonvenomous arthropods, initial encounter: Secondary | ICD-10-CM | POA: Diagnosis not present

## 2015-10-27 MED ORDER — HALOBETASOL PROPIONATE 0.05 % EX CREA: TOPICAL_CREAM | Freq: Two times a day (BID) | CUTANEOUS | Status: DC

## 2015-10-27 NOTE — Patient Instructions (Addendum)
You have an allergic reaction to tick and insect bites  Watch for rash/fever/headache or a bullseye shaped mark and let me know  Stay cool Clean areas with soap and water  Use the ultravate cream as needed Zyrtec 10 mg over the counter may also help

## 2015-10-27 NOTE — Assessment & Plan Note (Signed)
Right breast and L abdomen  Excoriated -no bullseye shape  No other rash  She does have numerous mosquito bites on shoulders No constitutional symptoms or fever  Inst:  You have an allergic reaction to tick and insect bites  Watch for rash/fever/headache or a bullseye shaped mark and let me know  Stay cool Clean areas with soap and water  Use the ultravate cream as needed Zyrtec 10 mg over the counter may also help

## 2015-10-27 NOTE — Progress Notes (Signed)
Pre visit review using our clinic review tool, if applicable. No additional management support is needed unless otherwise documented below in the visit note. 

## 2015-10-27 NOTE — Progress Notes (Signed)
Subjective:    Patient ID: Crystal Gonzalez, female    DOB: 1956-02-01, 60 y.o.   MRN: [id number]  HPI Here for 2 tick bites  Pt was seen about a month ago at [facility] clinic for rash concerning /and she had 2 tick bites on both sides of abdomen Neg lyme test Was tx with doxycycline That got better   Has tick bite R breast- is new - and tick is out  Tick was on her Saturday (after trimming bushes)- got it off on Sunday Not engorged  Tiny - like size of poppyseed  Red and itchy now   The bite on her L side/ abdomen - that is the old one treated a month ago -was treated / itchy  Still wants to make sure there is no tick there  Red - from scratching Used benadryl   No fever  No rash  Has had little ha from painting with an oil based paint   Declines use of insect repellent because she heard it causes cancer  Patient Active Problem List   Diagnosis Date Noted  . Tick bites 10/27/2015  . Early satiety 12/17/2014  . Acute hemorrhoid 12/17/2014  . TIA (transient ischemic attack) 07/03/2013  . Hyperlipidemia 01/13/2009  . ALLERGIC RHINITIS 01/13/2009  . Asthma, moderate persistent 01/13/2009  . G E R D 01/13/2009   Past Medical History  Diagnosis Date  . Asthma   . UTI (lower urinary tract infection)   . Hemorrhoids    Past Surgical History  Procedure Laterality Date  . Myomectomy    . Knee surgery      x 5 surgeries  . Breast biopsy     Social History  Substance Use Topics  . Smoking status: Former Smoker    Types: Cigarettes  . Smokeless tobacco: Never Used  . Alcohol Use: 0.0 oz/week    0 Standard drinks or equivalent per week     Comment: occ   Family History  Problem Relation Age of Onset  . Stroke Mother   . Hypertension Mother   . Heart disease Mother 33    massive MI  . Hypertension Father   . Heart disease Father   . Prostate cancer Father   . Breast cancer Maternal Aunt   . Cancer Maternal Aunt 40    breast cancer   Allergies  Allergen  Reactions  . Iodine   . Peanut-Containing Drug Products   . Tetanus Toxoid   . Prednisone Rash   Current Outpatient Prescriptions on File Prior to Visit  Medication Sig Dispense Refill  . acetaminophen (TYLENOL) 500 MG tablet Take 750 mg by mouth as needed.    Marland Kitchen albuterol (PROVENTIL HFA;VENTOLIN HFA) 108 (90 BASE) MCG/ACT inhaler Inhale 2 puffs into the lungs every 6 (six) hours as needed for wheezing or shortness of breath. 1 Inhaler 0  . albuterol (PROVENTIL) (2.5 MG/3ML) 0.083% nebulizer solution Take 3 mLs (2.5 mg total) by nebulization every 6 (six) hours as needed for wheezing or shortness of breath. 150 mL 1  . doxycycline (VIBRA-TABS) 100 MG tablet Take 1 tablet (100 mg total) by mouth 2 (two) times daily. 20 tablet 0  . EPINEPHrine 0.3 mg/0.3 mL IJ SOAJ injection Inject 0.3 mLs (0.3 mg total) into the muscle once. 1 Device 0  . Fluticasone-Salmeterol (ADVAIR DISKUS) 250-50 MCG/DOSE AEPB INHALE 1 PUFF INTO THE LUNGS TWICE DAILY 180 each 3  . HYDROcodone-acetaminophen (NORCO/VICODIN) 5-325 MG tablet Take 1-2 tablets by mouth every 6 hours  as needed for pain and/or cough. 19 tablet 0  . hydrocortisone (ANUSOL-HC) 2.5 % rectal cream Place 1 application rectally 2 (two) times daily. 30 g 0  . montelukast (SINGULAIR) 10 MG tablet Take 1 tablet (10 mg total) by mouth at bedtime. 90 tablet 3  . pantoprazole (PROTONIX) 20 MG tablet Take 1 tablet (20 mg total) by mouth daily. 90 tablet 1  . triamcinolone (NASACORT ALLERGY 24HR) 55 MCG/ACT AERO nasal inhaler Place 2 sprays into the nose daily. 3 Inhaler 1   No current facility-administered medications on file prior to visit.    Review of Systems Review of Systems  Constitutional: Negative for fever, appetite change, fatigue and unexpected weight change.  Eyes: Negative for pain and visual disturbance.  Respiratory: Negative for cough and shortness of breath.   Cardiovascular: Negative for cp or palpitations    Gastrointestinal: Negative for  nausea, diarrhea and constipation.  Genitourinary: Negative for urgency and frequency.  Skin: Negative for pallor or rash  Pos for itchy insect bites and tick bites   Neurological: Negative for weakness, light-headedness, numbness and headaches.  Hematological: Negative for adenopathy. Does not bruise/bleed easily.  Psychiatric/Behavioral: Negative for dysphoric mood. The patient is not nervous/anxious.         Objective:   Physical Exam  Constitutional: She appears well-developed and well-nourished. No distress.  obese and well appearing   HENT:  Head: Normocephalic and atraumatic.  Eyes: Conjunctivae and EOM are normal. Pupils are equal, round, and reactive to light. Right eye exhibits no discharge. Left eye exhibits no discharge.  Neck: Normal range of motion. Neck supple.  Cardiovascular: Normal rate and regular rhythm.   Pulmonary/Chest: Effort normal and breath sounds normal.  Lymphadenopathy:    She has no cervical adenopathy.  Neurological: She is alert.  Skin: Skin is warm and dry. There is erythema. No pallor.  Tick bite R breast- 2 cm of erythema , no remaining tick   Tick bite L low abd- excoriation and scab, no remaining tick, 1.5 cm of erythema  Adhesive reaction seen in band aid distribution from prior band aid   Multiple mosquito bites on both shoulders  No rash  Psychiatric: She has a normal mood and affect.          Assessment & Plan:   Problem List Items Addressed This Visit      Musculoskeletal and Integument   Tick bites - Primary    Right breast and L abdomen  Excoriated -no bullseye shape  No other rash  She does have numerous mosquito bites on shoulders No constitutional symptoms or fever  Inst:  You have an allergic reaction to tick and insect bites  Watch for rash/fever/headache or a bullseye shaped mark and let me know  Stay cool Clean areas with soap and water  Use the ultravate cream as needed Zyrtec 10 mg over the counter may also  help

## 2015-11-02 DIAGNOSIS — L989 Disorder of the skin and subcutaneous tissue, unspecified: Secondary | ICD-10-CM | POA: Diagnosis not present

## 2015-11-02 DIAGNOSIS — D485 Neoplasm of uncertain behavior of skin: Secondary | ICD-10-CM | POA: Diagnosis not present

## 2015-11-23 ENCOUNTER — Other Ambulatory Visit: Payer: Self-pay | Admitting: Family Medicine

## 2015-12-02 ENCOUNTER — Ambulatory Visit (INDEPENDENT_AMBULATORY_CARE_PROVIDER_SITE_OTHER): Payer: Medicare Other | Admitting: Family Medicine

## 2015-12-02 ENCOUNTER — Encounter: Payer: Self-pay | Admitting: Family Medicine

## 2015-12-02 VITALS — BP 179/86 | HR 72 | Temp 98.3°F | Ht 59.25 in | Wt 191.0 lb

## 2015-12-02 DIAGNOSIS — R21 Rash and other nonspecific skin eruption: Secondary | ICD-10-CM

## 2015-12-02 NOTE — Assessment & Plan Note (Signed)
Will eval with labs cbc and CMET.  Will review records from dermatology.

## 2015-12-02 NOTE — Patient Instructions (Addendum)
Stop at lab on way out.  Apply topical steroid twice daily to itchy areas.Limit use as able to flares.  Start cetaphil moisturizing cream.

## 2015-12-02 NOTE — Progress Notes (Signed)
Subjective:    Patient ID: Crystal Gonzalez, female    DOB: 05-22-1956, 60 y.o.   MRN: [id number]  HPI  60 year old female presents to follow up on bug bites and lab testing.   She was seen at [facility] for  Rash, tick bites in 09/2015 Neg lyme test Was tx with doxycycline That got better    She was seen again on 10/27/2015 by Dr. Glori Bickers for a tick bite on her right breast. No concerning symptoms. Dx with  Local allergic reaction from tick bite and mosquito bites.   Today she reports bites in upper back and legs, arms.  Very itchy. She was by Delaney Meigs. Did biopsy of skin lesions.. Felt it was from bites.  Treated with prednisone x 2-3 weeks. This  Helped a lot with itching.  Now using halobetasol for itching twice daily.   She continue to get bitten by bugs outdoors. She has always had a reaction but never this bad.   Dermatologist was concerned that she may have a form of leukemia given her aggressive reaction to the bites. No unexpected weight loss. No night sweats new. No fever. She is fatigued. Good appetite but does not eat much.      Social History /Family History/Past Medical History reviewed and updated if needed.   Review of Systems  Constitutional: Negative for fever and fatigue.  HENT: Negative for ear pain.   Eyes: Negative for pain.  Respiratory: Negative for chest tightness and shortness of breath.   Cardiovascular: Negative for chest pain, palpitations and leg swelling.  Gastrointestinal: Negative for abdominal pain.  Genitourinary: Negative for dysuria.       Objective:   Physical Exam  Constitutional: Vital signs are normal. She appears well-developed and well-nourished. She is cooperative.  Non-toxic appearance. She does not appear ill. No distress.  HENT:  Head: Normocephalic.  Right Ear: Hearing, tympanic membrane, external ear and ear canal normal. Tympanic membrane is not erythematous, not retracted and not bulging.  Left Ear: Hearing,  tympanic membrane, external ear and ear canal normal. Tympanic membrane is not erythematous, not retracted and not bulging.  Nose: No mucosal edema or rhinorrhea. Right sinus exhibits no maxillary sinus tenderness and no frontal sinus tenderness. Left sinus exhibits no maxillary sinus tenderness and no frontal sinus tenderness.  Mouth/Throat: Uvula is midline, oropharynx is clear and moist and mucous membranes are normal.  Eyes: Conjunctivae, EOM and lids are normal. Pupils are equal, round, and reactive to light. Lids are everted and swept, no foreign bodies found.  Neck: Trachea normal and normal range of motion. Neck supple. Carotid bruit is not present. No thyroid mass and no thyromegaly present.  Cardiovascular: Normal rate, regular rhythm, S1 normal, S2 normal, normal heart sounds, intact distal pulses and normal pulses.  Exam reveals no gallop and no friction rub.   No murmur heard. Pulmonary/Chest: Effort normal and breath sounds normal. No tachypnea. No respiratory distress. She has no decreased breath sounds. She has no wheezes. She has no rhonchi. She has no rales.  Abdominal: Soft. Normal appearance and bowel sounds are normal. There is no tenderness.  Neurological: She is alert.  Skin: Skin is warm, dry and intact. No rash noted.  Multiple stages of lesion.. Starts as erythematous nodule with small vesicle or none, then dry flaky erythematous macule , then scab, and finally hyperpigmented patches across upper back legs and arms... No where else.  Psychiatric: Her speech is normal and behavior is normal. Judgment and  thought content normal. Her mood appears not anxious. Cognition and memory are normal. She does not exhibit a depressed mood.          Assessment & Plan:

## 2015-12-02 NOTE — Progress Notes (Signed)
Pre visit review using our clinic review tool, if applicable. No additional management support is needed unless otherwise documented below in the visit note. 

## 2015-12-03 ENCOUNTER — Telehealth: Payer: Self-pay | Admitting: Family Medicine

## 2015-12-03 LAB — CBC WITH DIFFERENTIAL/PLATELET
BASOS ABS: 0 10*3/uL (ref 0.0–0.1)
BASOS PCT: 0.3 % (ref 0.0–3.0)
EOS ABS: 0.4 10*3/uL (ref 0.0–0.7)
Eosinophils Relative: 4.9 % (ref 0.0–5.0)
HCT: 44.3 % (ref 36.0–46.0)
Hemoglobin: 14.9 g/dL (ref 12.0–15.0)
LYMPHS ABS: 1.9 10*3/uL (ref 0.7–4.0)
Lymphocytes Relative: 24.1 % (ref 12.0–46.0)
MCHC: 33.6 g/dL (ref 30.0–36.0)
MCV: 87.5 fl (ref 78.0–100.0)
Monocytes Absolute: 0.7 10*3/uL (ref 0.1–1.0)
Monocytes Relative: 8.9 % (ref 3.0–12.0)
NEUTROS ABS: 4.8 10*3/uL (ref 1.4–7.7)
NEUTROS PCT: 61.8 % (ref 43.0–77.0)
PLATELETS: 373 10*3/uL (ref 150.0–400.0)
RBC: 5.07 Mil/uL (ref 3.87–5.11)
RDW: 14.5 % (ref 11.5–15.5)
WBC: 7.7 10*3/uL (ref 4.0–10.5)

## 2015-12-03 LAB — COMPREHENSIVE METABOLIC PANEL
ALT: 37 U/L — AB (ref 0–35)
AST: 33 U/L (ref 0–37)
Albumin: 4.3 g/dL (ref 3.5–5.2)
Alkaline Phosphatase: 71 U/L (ref 39–117)
BILIRUBIN TOTAL: 0.3 mg/dL (ref 0.2–1.2)
BUN: 16 mg/dL (ref 6–23)
CHLORIDE: 102 meq/L (ref 96–112)
CO2: 30 meq/L (ref 19–32)
CREATININE: 0.92 mg/dL (ref 0.40–1.20)
Calcium: 10.5 mg/dL (ref 8.4–10.5)
GFR: 66.4 mL/min (ref >60.00)
GLUCOSE: 85 mg/dL (ref 70–99)
Potassium: 4.3 mEq/L (ref 3.5–5.1)
SODIUM: 139 meq/L (ref 135–145)
Total Protein: 7.6 g/dL (ref 6.0–8.3)

## 2015-12-03 NOTE — Telephone Encounter (Signed)
See result note on labs from 12/02/2015.

## 2015-12-03 NOTE — Telephone Encounter (Signed)
Patient returned Donna's call. °

## 2015-12-20 ENCOUNTER — Other Ambulatory Visit: Payer: Self-pay | Admitting: Family Medicine

## 2015-12-20 NOTE — Telephone Encounter (Signed)
Received refill electronically Last refill 10/27/15 50 G Last office visit 12/02/15/acute

## 2016-01-04 ENCOUNTER — Other Ambulatory Visit: Payer: Self-pay | Admitting: Family Medicine

## 2016-01-12 DIAGNOSIS — Z85828 Personal history of other malignant neoplasm of skin: Secondary | ICD-10-CM | POA: Diagnosis not present

## 2016-01-12 DIAGNOSIS — L309 Dermatitis, unspecified: Secondary | ICD-10-CM | POA: Diagnosis not present

## 2016-02-15 ENCOUNTER — Other Ambulatory Visit: Payer: Self-pay | Admitting: Family Medicine

## 2016-02-15 ENCOUNTER — Encounter: Payer: Medicare Other | Admitting: Family Medicine

## 2016-02-17 ENCOUNTER — Other Ambulatory Visit: Payer: Self-pay | Admitting: Family Medicine

## 2016-02-17 NOTE — Telephone Encounter (Signed)
Last office visit 12/02/2015 for rash.  Last refilled 12/20/2015 for 50g.

## 2016-03-17 ENCOUNTER — Other Ambulatory Visit (HOSPITAL_COMMUNITY)
Admission: RE | Admit: 2016-03-17 | Discharge: 2016-03-17 | Disposition: A | Discharge status: Home / Self Care | Payer: Medicare Other | Source: Ambulatory Visit | Attending: Family Medicine | Admitting: Family Medicine

## 2016-03-17 ENCOUNTER — Ambulatory Visit (INDEPENDENT_AMBULATORY_CARE_PROVIDER_SITE_OTHER): Payer: Medicare Other | Admitting: Family Medicine

## 2016-03-17 ENCOUNTER — Encounter: Payer: Self-pay | Admitting: Family Medicine

## 2016-03-17 VITALS — BP 123/66 | HR 69 | Temp 97.9°F | Ht 59.5 in | Wt 187.8 lb

## 2016-03-17 DIAGNOSIS — Z1151 Encounter for screening for human papillomavirus (HPV): Secondary | ICD-10-CM | POA: Diagnosis not present

## 2016-03-17 DIAGNOSIS — Z124 Encounter for screening for malignant neoplasm of cervix: Secondary | ICD-10-CM

## 2016-03-17 DIAGNOSIS — E781 Pure hyperglyceridemia: Secondary | ICD-10-CM | POA: Diagnosis not present

## 2016-03-17 DIAGNOSIS — Z01419 Encounter for gynecological examination (general) (routine) without abnormal findings: Secondary | ICD-10-CM | POA: Diagnosis not present

## 2016-03-17 DIAGNOSIS — R21 Rash and other nonspecific skin eruption: Secondary | ICD-10-CM | POA: Diagnosis not present

## 2016-03-17 DIAGNOSIS — Z0000 Encounter for general adult medical examination without abnormal findings: Secondary | ICD-10-CM | POA: Diagnosis not present

## 2016-03-17 DIAGNOSIS — J4540 Moderate persistent asthma, uncomplicated: Secondary | ICD-10-CM

## 2016-03-17 NOTE — Assessment & Plan Note (Addendum)
Pt request lyme test.  She is concerned about alpha gal syndrome... Reviewed report from [place] today. reseasrch just started.. No test available.  Allergy should fade with time.

## 2016-03-17 NOTE — Progress Notes (Signed)
Pre visit review using our clinic review tool, if applicable. No additional management support is needed unless otherwise documented below in the visit note. 

## 2016-03-17 NOTE — Assessment & Plan Note (Signed)
Due for re-eval. Encouraged exercise, weight loss, healthy eating habits.  

## 2016-03-17 NOTE — Patient Instructions (Addendum)
Stop at lab on way out   Schedule mammogram on your own.  Complete complete [place].

## 2016-03-17 NOTE — Addendum Note (Signed)
Addended by: Carter Kitten on: 03/17/2016 10:17 AM   Modules accepted: Orders

## 2016-03-17 NOTE — Assessment & Plan Note (Signed)
Stable on current regimen   

## 2016-03-17 NOTE — Progress Notes (Signed)
The patient is here for annual wellness exam and preventative care.  I have personally reviewed the Medicare Annual Wellness questionnaire and have noted 1. The patient's medical and social history 2. Their use of alcohol, tobacco or illicit drugs 3. Their current medications and supplements 4. The patient's functional ability including ADL's, fall risks, home safety risks and hearing or visual             impairment. 5. Diet and physical activities 6. Evidence for depression or mood disorders 7.         Updated provider list Cognitive evaluation was performed and recorded on pt medicare questionnaire form. The patients weight, height, BMI and visual acuity have been recorded in the chart  I have made referrals, counseling and provided education to the patient based review of the above and I have provided the pt with a written personalized care plan for preventive services.    She would like to be tested for a sugar transmitted by lone star tick that she feels causes beef, veal pork allergy. She feels that this is what made her have this severe rash over the summer.   Since avoiding beef, pork and gel capsule... He rash is almost gone.  She would also like to be retested for lyme disease. Bitten and ring lesion in 09/2015. Treated with doxycycline.   She still had additional tick bite after that. She is fatigued, no joint pain, swelling.   BP Readings from Last 3 Encounters:  03/17/16 123/66  12/02/15 (!) 179/86  10/27/15 122/62    Wt Readings from Last 3 Encounters:  03/17/16 187 lb 12 oz (85.2 kg)  12/02/15 191 lb (86.6 kg)  10/27/15 195 lb 4 oz (88.6 kg)   Hyperlipidemia: Due for re-eval.. LDL goal <70, due to TIA. She refuses statins or any natural med for chol. Lab Results  Component Value Date   CHOL 191 02/11/2015   HDL 60.90 02/11/2015   LDLCALC 115 (H) 02/11/2015   TRIG 75.0 02/11/2015   CHOLHDL 3 02/11/2015   Needs eval for diabetes, liver and kidney  function.  Exercising: 20 min 4 times a week.  She has stopped alcohol.  Diet: healthy, less meat, low chol.    Social History /Family History/Past Medical History reviewed and updated if needed.  Review of Systems  Constitutional: Negative for fever and  She does have fatigue.  HENT: Negative for ear pain.  Eyes: Negative for pain.  Respiratory: Negative for chest tightness and shortness of breath.  Cardiovascular: Negative for chest pain, palpitations and leg swelling.  Gastrointestinal: Negative for abdominal pain.  Genitourinary: Negative for dysuria.  Musculoskeletal: Positive for arthralgias and back pain.   Denies depression.      Objective:   Physical Exam  Constitutional: Vital signs are normal. She appears well-developed and well-nourished. She is cooperative. Non-toxic appearance. She does not appear ill. No distress.  HENT:  Head: Normocephalic.  Right Ear: Hearing, tympanic membrane, external ear and ear canal normal.  Left Ear: Hearing, tympanic membrane, external ear and ear canal normal.  Nose: Nose normal.  Eyes: Conjunctivae, EOM and lids are normal. Pupils are equal, round, and reactive to light. Lids are everted and swept, no foreign bodies found.  Neck: Trachea normal and normal range of motion. Neck supple. Carotid bruit is not present. No mass and no thyromegaly present.  Cardiovascular: Normal rate, regular rhythm, S1 normal, S2 normal, normal heart sounds and intact distal pulses. Exam reveals no gallop.  No murmur  heard. Pulmonary/Chest: Effort normal and breath sounds normal. No respiratory distress. She has no wheezes. She has no rhonchi. She has no rales.  Abdominal: Soft. Normal appearance and bowel sounds are normal. She exhibits no distension, no fluid wave, no abdominal bruit and no mass. There is no hepatosplenomegaly. There is no tenderness. There is no rebound, no guarding and no CVA tenderness. No hernia.  Genitourinary: No  breast swelling, tenderness, discharge or bleeding.  Normal introitus for age, no external lesions, no vaginal discharge, mucosa pink and moist, no vaginal or cervical lesions, no vaginal atrophy, no friaility or hemorrhage, normal uterus size and position, no adnexal masses or tenderness.  Chaperoned exam.  Lymphadenopathy:   She has no cervical adenopathy.   She has no axillary adenopathy.  Neurological: She is alert. She has normal strength. No cranial nerve deficit or sensory deficit.  Skin: Skin is warm, dry and intact. No rash noted.  Psychiatric: Her speech is normal and behavior is normal. Judgment normal. Her mood appears not anxious. Cognition and memory are normal. She does not exhibit a depressed mood.          Assessment & Plan:  The patient's preventative maintenance and recommended screening tests for an annual wellness exam were reviewed in full today. Brought up to date unless services declined.  Counselled on the importance of diet, exercise, and its role in overall health and mortality. The patient's FH and SH was reviewed, including their home life, tobacco status, and drug and alcohol status.    Last mammogram 8 years ago. Due now. Last pelvic exam:  8  years ago. Due now. Colonoscopy: 2003 nml, plans yearly IFOB, neg in 2016 plans cologuard Vaccines: Due Tdap but she is allergic. Refused flu. Nonsmoker Hep C: neg  HIV: refused.

## 2016-03-20 LAB — CYTOLOGY - PAP
DIAGNOSIS: NEGATIVE
HPV: NOT DETECTED

## 2016-03-20 LAB — LYME AB/WESTERN BLOT REFLEX: B burgdorferi Ab IgG+IgM: <0.9 Index (ref <0.90)

## 2016-04-02 ENCOUNTER — Other Ambulatory Visit: Payer: Self-pay | Admitting: Family Medicine

## 2016-04-11 DIAGNOSIS — Z1211 Encounter for screening for malignant neoplasm of colon: Secondary | ICD-10-CM | POA: Diagnosis not present

## 2016-04-11 DIAGNOSIS — Z1212 Encounter for screening for malignant neoplasm of rectum: Secondary | ICD-10-CM | POA: Diagnosis not present

## 2016-04-11 LAB — COLOGUARD: COLOGUARD: NEGATIVE

## 2016-04-21 ENCOUNTER — Encounter: Payer: Self-pay | Admitting: Family Medicine

## 2016-05-19 ENCOUNTER — Other Ambulatory Visit: Payer: Self-pay | Admitting: Family Medicine

## 2016-05-31 ENCOUNTER — Other Ambulatory Visit: Payer: Self-pay | Admitting: Family Medicine

## 2016-05-31 NOTE — Telephone Encounter (Signed)
Ok to refill? Electronically refill request for   halobetasol (ULTRAVATE) 0.05 % cream for rash  Last prescribed on 02/18/2016. Last seen on 03/17/2016.

## 2016-07-27 ENCOUNTER — Encounter: Payer: Self-pay | Admitting: Family Medicine

## 2016-07-27 ENCOUNTER — Ambulatory Visit (INDEPENDENT_AMBULATORY_CARE_PROVIDER_SITE_OTHER): Payer: Medicare Other | Admitting: Family Medicine

## 2016-07-27 ENCOUNTER — Telehealth: Payer: Self-pay | Admitting: Family Medicine

## 2016-07-27 VITALS — BP 122/80 | HR 77 | Temp 98.4°F | Resp 12 | Ht 59.5 in | Wt 183.4 lb

## 2016-07-27 DIAGNOSIS — J4541 Moderate persistent asthma with (acute) exacerbation: Secondary | ICD-10-CM

## 2016-07-27 DIAGNOSIS — J309 Allergic rhinitis, unspecified: Secondary | ICD-10-CM

## 2016-07-27 DIAGNOSIS — J988 Other specified respiratory disorders: Secondary | ICD-10-CM | POA: Diagnosis not present

## 2016-07-27 MED ORDER — AZITHROMYCIN 250 MG PO TABS: ORAL_TABLET | ORAL | 0 refills | Status: AC

## 2016-07-27 NOTE — Progress Notes (Signed)
HPI:  ACUTE VISIT  Chief Complaint  Patient presents with  . Fever  . Cough    [name] is a 61 y.o.female here today complaining of 4 days of respiratory symptoms.  Productive cough with initially clear and now greenish sputum. She thought symptoms were related to allergies because family was visiting and sister was wearing a "strong perfume".  2-3 weeks ago she had low grade fever for a couple days.  + Nasal congestion, rhinorrhea, sore throat, and post nasal drainage. + Fever today (100.1 F), she has not noted chills or body aches. +Global headache, pressure like, mild to moderate.  She has not noted chest pain or dyspnea + Wheezing, Hx of asthma, she has not used Albuterol inh.  No Hx of recent travel. Sick contact: Family No known insect bite.  Hx of allergies: Yes.  OTC medications for this problem: Robitussin and cold medications.  Symptoms getting worse. She is requesting Z-Pack, it has helped in the past,she thinks infection is in her "chest."   Review of Systems  Constitutional: Positive for fatigue and fever. Negative for appetite change.  HENT: Positive for congestion, postnasal drip, rhinorrhea, sinus pressure and sore throat. Negative for ear pain, mouth sores, sneezing, trouble swallowing and voice change.   Eyes: Negative for discharge, redness and visual disturbance.  Respiratory: Positive for cough and wheezing. Negative for shortness of breath.   Cardiovascular: Negative for chest pain.  Gastrointestinal: Negative for abdominal pain, diarrhea, nausea and vomiting.  Musculoskeletal: Negative for myalgias and neck pain.  Skin: Negative for rash.  Allergic/Immunologic: Positive for environmental allergies.  Neurological: Positive for headaches. Negative for syncope and weakness.  Hematological: Negative for adenopathy. Does not bruise/bleed easily.  Psychiatric/Behavioral: Negative for confusion. The patient is nervous/anxious.        Current Outpatient Prescriptions on File Prior to Visit  Medication Sig Dispense Refill  . acetaminophen (TYLENOL) 500 MG tablet Take 750 mg by mouth as needed.    Marland Kitchen ADVAIR DISKUS 250-50 MCG/DOSE AEPB INHALE 1 PUFF INTO THE LUNGS TWICE DAILY 180 each 3  . albuterol (PROVENTIL HFA;VENTOLIN HFA) 108 (90 BASE) MCG/ACT inhaler Inhale 2 puffs into the lungs every 6 (six) hours as needed for wheezing or shortness of breath. 1 Inhaler 0  . albuterol (PROVENTIL) (2.5 MG/3ML) 0.083% nebulizer solution Take 3 mLs (2.5 mg total) by nebulization every 6 (six) hours as needed for wheezing or shortness of breath. 150 mL 1  . EPIPEN 2-PAK 0.3 MG/0.3ML SOAJ injection INJECT 0.3 MLS (0.3 MG TOTAL) INTO THE MUSCLE ONCE.  0  . halobetasol (ULTRAVATE) 0.05 % cream APPLY TOPICALLY TO AFFECTED ITCHY AREAS 2 TIMES DAILY AS NEEDED 50 g 0  . montelukast (SINGULAIR) 10 MG tablet TAKE 1 TABLET BY MOUTH AT BEDTIME 90 tablet 3  . triamcinolone (NASACORT ALLERGY 24HR) 55 MCG/ACT AERO nasal inhaler Place 2 sprays into the nose daily. 3 Inhaler 1   No current facility-administered medications on file prior to visit.      Past Medical History:  Diagnosis Date  . Asthma   . Hemorrhoids   . UTI (lower urinary tract infection)    Allergies  Allergen Reactions  . Iodine   . Peanut-Containing Drug Products   . Tetanus Toxoid   . Prednisone Rash    Social History   Social History  . Marital status: Widowed    Spouse name: N/A  . Number of children: N/A  . Years of education: N/A  Social History Main Topics  . Smoking status: Former Smoker    Types: Cigarettes  . Smokeless tobacco: Never Used  . Alcohol use 0.0 oz/week     Comment: occ  . Drug use: No  . Sexual activity: Not Asked   Other Topics Concern  . None   Social History Narrative  . None    Vitals:   07/27/16 1203  BP: 122/80  Pulse: 77  Resp: 12  Temp: 98.4 F (36.9 C)  O2 sat 96% at RA. Body mass index is 36.42  kg/m.   Physical Exam  Nursing note and vitals reviewed. Constitutional: She is oriented to person, place, and time. She appears well-developed. She does not appear ill. No distress.  HENT:  Head: Atraumatic.  Right Ear: Tympanic membrane, external ear and ear canal normal.  Left Ear: External ear and ear canal normal. Tympanic membrane is not erythematous. A middle ear effusion is present.  Nose: Rhinorrhea present. Right sinus exhibits no maxillary sinus tenderness and no frontal sinus tenderness. Left sinus exhibits no maxillary sinus tenderness and no frontal sinus tenderness.  Mouth/Throat: Oropharynx is clear and moist and mucous membranes are normal.  Hypertrophic turbinates. Post nasal drainage.  Eyes: Conjunctivae are normal.  Cardiovascular: Normal rate and regular rhythm.   Respiratory: Effort normal. No stridor. No respiratory distress. She has wheezes (mild,diffuse.). She has no rales.  Lymphadenopathy:       Head (right side): No submandibular adenopathy present.       Head (left side): No submandibular adenopathy present.    She has no cervical adenopathy.  Neurological: She is alert and oriented to person, place, and time. She has normal strength. Coordination normal.  Stable gait not assisted, left knee brace.  Skin: Skin is warm. No rash noted. No erythema.  Psychiatric: Her speech is normal. Her mood appears anxious.  Well groomed, good eye contact.      ASSESSMENT AND PLAN:   Taigan was seen today for fever and cough.  Diagnoses and all orders for this visit:  Respiratory tract infection  Explained symptoms could be caused by a viral illness, in which case antibiotic treatment will not help. Because symptoms getting worse and reporting fever to 2-3 weeks ago, I agree with azithromycin treatment for 5 days. Some side effects discussed. She can continue with OTC Robitussin. Instructed about warning signs, follow-up as needed with PCP.   -      azithromycin (ZITHROMAX) 250 MG tablet; 2 tabs today then 1 tab daily until gone.  Moderate persistent asthma with acute exacerbation  She is allergic to Prednisone. Albuterol inh 2 puff every 6 hours for a week then as needed for wheezing or shortness of breath.  Continue Singulair 10 mg daily. Instructed about warning signs. Follow-up with PCP in 1-2 weeks if needed.   Allergic rhinitis, unspecified chronicity, unspecified seasonality, unspecified trigger  Some of her symptoms could be attributed/aggravated by allergies. Nasal irrigations with saline. Continue Singulair 10 mg daily. Consider OTC antihistaminic.    -Ms. Saje Swearingen was advised to return or notify a doctor immediately if symptoms worsen or persist or new concerns arise.       Jasnoor Trussell G. Martinique, MD  [facility]. [place] office.

## 2016-07-27 NOTE — Patient Instructions (Signed)
  [name] I have seen you today for an acute visit.  A few things to remember from today's visit:   Respiratory tract infection - Plan: azithromycin (ZITHROMAX) 250 MG tablet  Moderate persistent asthma with acute exacerbation    Albuterol inh 2 puff every 6 hours for a week then as needed for wheezing or shortness of breath.     It could be viral, in which case antibiotic will not help.    Medications prescribed today are intended for short period of time and will not be refill upon request, a follow up appointment might be necessary to discuss continuation of of treatment if appropriate.     In general please monitor for signs of worsening symptoms and seek immediate medical attention if any concerning.  If symptoms are not resolved in 2 weeks you should schedule a follow up appointment with your doctor, before if needed.

## 2016-07-27 NOTE — Telephone Encounter (Signed)
Pt has appt with Dr Betty Martinique today at 12 noon.

## 2016-07-27 NOTE — Progress Notes (Signed)
Pre visit review using our clinic review tool, if applicable. No additional management support is needed unless otherwise documented below in the visit note. 

## 2016-07-27 NOTE — Telephone Encounter (Signed)
Patient Name: Crystal Gonzalez DOB: 1955/12/21 Initial Comment fever 102, congested, cough, trouble breathing, has asthma, going into her longs Nurse Assessment Nurse: Ronnald Ramp, RN, Miranda Date/Time (Eastern Time): 07/27/2016 11:10:55 AM Confirm and document reason for call. If symptomatic, describe symptoms. ---Caller states she has been having symptoms since Sunday. Started with congestion and headache. Now with cough and fever. Temp 102. Has not been using her inhaler. Does the patient have any new or worsening symptoms? ---Yes Will a triage be completed? ---Yes Related visit to physician within the last 2 weeks? ---No Does the PT have any chronic conditions? (i.e. diabetes, asthma, etc.) ---Yes List chronic conditions. ---Asthma, Knee problems, back problems, Allergies Is this a behavioral health or substance abuse call? ---No Guidelines Guideline Title Affirmed Question Affirmed Notes Asthma Attack [1] Influenza prevalent in community (or household) AND [2] flu symptoms (e.g., cough WITH fever, etc) AND [3] onset < 48 hours ago Final Disposition User Call PCP Now Ronnald Ramp, RN, Miranda Comments Caller did not want care advice, She just wants an antibiotic. Explained she would need to be seen. Told caller the Albuterol inhaler is to help with trouble breathing and she said she did not want to "get hooked on another inhaler" No appt with PCP or at primary office. Appt scheduled for today at 12pm at [facility] with Dr. Martinique Referrals GO TO FACILITY OTHER - SPECIFY Disagree/Comply: Comply

## 2016-08-15 ENCOUNTER — Other Ambulatory Visit: Payer: Self-pay | Admitting: Family Medicine

## 2016-08-15 MED ORDER — HALOBETASOL PROPIONATE 0.05 % EX CREA: TOPICAL_CREAM | CUTANEOUS | 0 refills | Status: DC

## 2016-08-15 NOTE — Telephone Encounter (Signed)
Spoke with [facility].  She states she needs a refill on the halobetasol not the procto cream.  Pharmacy requested wrong cream.

## 2016-08-15 NOTE — Telephone Encounter (Signed)
Last office visit 03/17/2016.  Not on current medication list.  Refill?

## 2016-08-15 NOTE — Telephone Encounter (Signed)
Call pt to inquire symptoms.  Okay to refill if document no heavy rectal bleeding, lightheaded, abdominal pain.  MAke sure pt adequately treating and controlling constipation. If not let me know.  Make sure pt knows if hemorrhoids not improving to follow up for re-evaluation.

## 2016-10-03 ENCOUNTER — Encounter: Payer: Self-pay | Admitting: Primary Care

## 2016-10-03 ENCOUNTER — Ambulatory Visit (INDEPENDENT_AMBULATORY_CARE_PROVIDER_SITE_OTHER): Payer: Medicare Other | Admitting: Primary Care

## 2016-10-03 VITALS — BP 124/84 | HR 80 | Temp 98.2°F | Ht 59.5 in | Wt 184.8 lb

## 2016-10-03 DIAGNOSIS — T148XXA Other injury of unspecified body region, initial encounter: Secondary | ICD-10-CM

## 2016-10-03 NOTE — Progress Notes (Signed)
Subjective:    Patient ID: Crystal Gonzalez, female    DOB: 1956-01-26, 61 y.o.   MRN: [id number]  HPI  Crystal Gonzalez is a 61 year old female who presents today with a chief complaint of skin tear. The skin tear occurred yesterday after her brother's dog accidentally and playfully clawed her arm. She has noticed burning. She cleaned the abrasion several times and has applied Vasaline. She denies drainage, erythema, swelling since the incidence. The dog is up-to-date with vaccinations.  Review of Systems  Constitutional: Negative for fever.  Skin: Positive for wound.       Past Medical History:  Diagnosis Date  . Asthma   . Hemorrhoids   . UTI (lower urinary tract infection)      Social History   Social History  . Marital status: Widowed    Spouse name: N/A  . Number of children: N/A  . Years of education: N/A   Occupational History  . Not on file.   Social History Main Topics  . Smoking status: Former Smoker    Types: Cigarettes  . Smokeless tobacco: Never Used  . Alcohol use 0.0 oz/week     Comment: occ  . Drug use: No  . Sexual activity: Not on file   Other Topics Concern  . Not on file   Social History Narrative  . No narrative on file    Past Surgical History:  Procedure Laterality Date  . BREAST BIOPSY    . KNEE SURGERY     x 5 surgeries  . MYOMECTOMY      Family History  Problem Relation Age of Onset  . Stroke Mother   . Hypertension Mother   . Heart disease Mother 52    massive MI  . Hypertension Father   . Heart disease Father   . Prostate cancer Father   . Breast cancer Maternal Aunt   . Cancer Maternal Aunt 40    breast cancer    Allergies  Allergen Reactions  . Iodine   . Peanut-Containing Drug Products   . Tetanus Toxoid   . Prednisone Rash    Current Outpatient Prescriptions on File Prior to Visit  Medication Sig Dispense Refill  . acetaminophen (TYLENOL) 500 MG tablet Take 750 mg by mouth as needed.    Marland Kitchen ADVAIR DISKUS 250-50  MCG/DOSE AEPB INHALE 1 PUFF INTO THE LUNGS TWICE DAILY 180 each 3  . albuterol (PROVENTIL HFA;VENTOLIN HFA) 108 (90 BASE) MCG/ACT inhaler Inhale 2 puffs into the lungs every 6 (six) hours as needed for wheezing or shortness of breath. 1 Inhaler 0  . albuterol (PROVENTIL) (2.5 MG/3ML) 0.083% nebulizer solution Take 3 mLs (2.5 mg total) by nebulization every 6 (six) hours as needed for wheezing or shortness of breath. 150 mL 1  . EPIPEN 2-PAK 0.3 MG/0.3ML SOAJ injection INJECT 0.3 MLS (0.3 MG TOTAL) INTO THE MUSCLE ONCE.  0  . halobetasol (ULTRAVATE) 0.05 % cream APPLY TOPICALLY TO AFFECTED ITCHY AREAS 2 TIMES DAILY AS NEEDED 50 g 0  . montelukast (SINGULAIR) 10 MG tablet TAKE 1 TABLET BY MOUTH AT BEDTIME 90 tablet 3  . pantoprazole (PROTONIX) 20 MG tablet Take 10 mg by mouth daily.    Marland Kitchen triamcinolone (NASACORT ALLERGY 24HR) 55 MCG/ACT AERO nasal inhaler Place 2 sprays into the nose daily. 3 Inhaler 1   No current facility-administered medications on file prior to visit.     BP 124/84   Pulse 80   Temp 98.2 F (36.8 C) (Oral)  Ht 4' 11.5" (1.511 m)   Wt 184 lb 12.8 oz (83.8 kg)   SpO2 97%   BMI 36.70 kg/m    Objective:   Physical Exam  Constitutional: She appears well-nourished.  Neck: Neck supple.  Pulmonary/Chest: Effort normal and breath sounds normal.  Skin: Skin is warm and dry.  1-2 cm x 1/2 cm superficial skin tear to right posterior forearm.          Assessment & Plan:  Skin Tear:  Located to right forearm since yesterday. Exam today without evidence of cellulitis or active infection. Will have her continue cleansing with warm water and application of Vaseline. Discussed to notify for signs of infection. Dressing applied in the office. Follow up PRN.  Sheral Flow, NP

## 2016-10-03 NOTE — Patient Instructions (Signed)
Continue to keep the skin clean.   Continue application of Vaseline for the next 1 week.   Please call me if you notice increased redness, swelling, pain, drainage.  It was a pleasure meeting you!

## 2016-10-03 NOTE — Progress Notes (Signed)
Pre visit review using our clinic review tool, if applicable. No additional management support is needed unless otherwise documented below in the visit note. 

## 2016-10-06 DIAGNOSIS — L239 Allergic contact dermatitis, unspecified cause: Secondary | ICD-10-CM | POA: Diagnosis not present

## 2016-10-06 DIAGNOSIS — S51811A Laceration without foreign body of right forearm, initial encounter: Secondary | ICD-10-CM | POA: Diagnosis not present

## 2017-03-02 DIAGNOSIS — Z08 Encounter for follow-up examination after completed treatment for malignant neoplasm: Secondary | ICD-10-CM | POA: Diagnosis not present

## 2017-03-02 DIAGNOSIS — Z85828 Personal history of other malignant neoplasm of skin: Secondary | ICD-10-CM | POA: Diagnosis not present

## 2017-03-02 DIAGNOSIS — L820 Inflamed seborrheic keratosis: Secondary | ICD-10-CM | POA: Diagnosis not present

## 2017-03-02 DIAGNOSIS — D485 Neoplasm of uncertain behavior of skin: Secondary | ICD-10-CM | POA: Diagnosis not present

## 2017-03-19 ENCOUNTER — Ambulatory Visit: Payer: Medicare Other

## 2017-03-21 ENCOUNTER — Encounter: Payer: Self-pay | Admitting: *Deleted

## 2017-03-21 ENCOUNTER — Other Ambulatory Visit: Payer: Self-pay | Admitting: Family Medicine

## 2017-03-27 ENCOUNTER — Ambulatory Visit: Payer: Medicare Other | Admitting: Family Medicine

## 2017-03-29 ENCOUNTER — Other Ambulatory Visit: Payer: Self-pay | Admitting: Family Medicine

## 2017-04-23 ENCOUNTER — Ambulatory Visit: Payer: Medicare Other

## 2017-04-24 ENCOUNTER — Ambulatory Visit (INDEPENDENT_AMBULATORY_CARE_PROVIDER_SITE_OTHER): Payer: Medicare Other

## 2017-04-24 VITALS — BP 120/78 | HR 67 | Temp 98.1°F | Ht 59.5 in | Wt 185.5 lb

## 2017-04-24 DIAGNOSIS — E781 Pure hyperglyceridemia: Secondary | ICD-10-CM

## 2017-04-24 DIAGNOSIS — Z0000 Encounter for general adult medical examination without abnormal findings: Secondary | ICD-10-CM | POA: Diagnosis not present

## 2017-04-24 LAB — CBC WITH DIFFERENTIAL/PLATELET
BASOS PCT: 1.8 % (ref 0.0–3.0)
Basophils Absolute: 0.1 10*3/uL (ref 0.0–0.1)
EOS ABS: 0.4 10*3/uL (ref 0.0–0.7)
Eosinophils Relative: 5.1 % — ABNORMAL HIGH (ref 0.0–5.0)
HEMATOCRIT: 44.3 % (ref 36.0–46.0)
Hemoglobin: 14.8 g/dL (ref 12.0–15.0)
LYMPHS PCT: 22.5 % (ref 12.0–46.0)
Lymphs Abs: 1.6 10*3/uL (ref 0.7–4.0)
MCHC: 33.3 g/dL (ref 30.0–36.0)
MCV: 91.6 fl (ref 78.0–100.0)
Monocytes Absolute: 0.5 10*3/uL (ref 0.1–1.0)
Monocytes Relative: 7 % (ref 3.0–12.0)
NEUTROS ABS: 4.4 10*3/uL (ref 1.4–7.7)
Neutrophils Relative %: 63.6 % (ref 43.0–77.0)
PLATELETS: 328 10*3/uL (ref 150.0–400.0)
RBC: 4.84 Mil/uL (ref 3.87–5.11)
RDW: 13.3 % (ref 11.5–15.5)
WBC: 7 10*3/uL (ref 4.0–10.5)

## 2017-04-24 LAB — LIPID PANEL
CHOL/HDL RATIO: 3
Cholesterol: 184 mg/dL (ref 0–200)
HDL: 66.7 mg/dL (ref >39.00)
LDL CALC: 108 mg/dL — AB (ref 0–99)
NonHDL: 117.73
TRIGLYCERIDES: 49 mg/dL (ref 0.0–149.0)
VLDL: 9.8 mg/dL (ref 0.0–40.0)

## 2017-04-24 LAB — COMPREHENSIVE METABOLIC PANEL
ALT: 25 U/L (ref 0–35)
AST: 26 U/L (ref 0–37)
Albumin: 4 g/dL (ref 3.5–5.2)
Alkaline Phosphatase: 71 U/L (ref 39–117)
BILIRUBIN TOTAL: 0.6 mg/dL (ref 0.2–1.2)
BUN: 20 mg/dL (ref 6–23)
CALCIUM: 10.5 mg/dL (ref 8.4–10.5)
CO2: 30 meq/L (ref 19–32)
Chloride: 105 mEq/L (ref 96–112)
Creatinine, Ser: 0.82 mg/dL (ref 0.40–1.20)
GFR: 75.47 mL/min (ref >60.00)
Glucose, Bld: 88 mg/dL (ref 70–99)
POTASSIUM: 4.9 meq/L (ref 3.5–5.1)
Sodium: 141 mEq/L (ref 135–145)
Total Protein: 7.2 g/dL (ref 6.0–8.3)

## 2017-04-24 NOTE — Progress Notes (Signed)
Subjective:   Crystal Gonzalez is a 61 y.o. female who presents for Medicare Annual (Subsequent) preventive examination.  Review of Systems:  N/A Cardiac Risk Factors include: advanced age (>29men, >22 women);obesity (BMI >30kg/m2);dyslipidemia     Objective:     Vitals: BP 120/78 (BP Location: Right Arm, Patient Position: Sitting, Cuff Size: Normal)   Pulse 67   Temp 98.1 F (36.7 C) (Oral)   Ht 4' 11.5" (1.511 m) Comment: shoes  Wt 185 lb 8 oz (84.1 kg)   SpO2 97%   BMI 36.84 kg/m   Body mass index is 36.84 kg/m.   Tobacco Social History   Tobacco Use  Smoking Status Former Smoker  . Types: Cigarettes  Smokeless Tobacco Never Used     Counseling given: No   Past Medical History:  Diagnosis Date  . Asthma   . Hemorrhoids   . UTI (lower urinary tract infection)    Past Surgical History:  Procedure Laterality Date  . BREAST BIOPSY    . KNEE SURGERY     x 5 surgeries  . MYOMECTOMY     Family History  Problem Relation Age of Onset  . Stroke Mother   . Hypertension Mother   . Heart disease Mother 38       massive MI  . Hypertension Father   . Heart disease Father   . Prostate cancer Father   . Breast cancer Maternal Aunt   . Cancer Maternal Aunt 40       breast cancer   Social History   Substance and Sexual Activity  Sexual Activity Not on file    Outpatient Encounter Medications as of 04/24/2017  Medication Sig  . acetaminophen (TYLENOL) 500 MG tablet Take 750 mg by mouth as needed.  Marland Kitchen ADVAIR DISKUS 250-50 MCG/DOSE AEPB INHALE 1 PUFF INTO THE LUNGS TWICE DAILY  . albuterol (PROVENTIL HFA;VENTOLIN HFA) 108 (90 BASE) MCG/ACT inhaler Inhale 2 puffs into the lungs every 6 (six) hours as needed for wheezing or shortness of breath.  Marland Kitchen albuterol (PROVENTIL) (2.5 MG/3ML) 0.083% nebulizer solution Take 3 mLs (2.5 mg total) by nebulization every 6 (six) hours as needed for wheezing or shortness of breath.  . EPIPEN 2-PAK 0.3 MG/0.3ML SOAJ injection INJECT  0.3 MLS (0.3 MG TOTAL) INTO THE MUSCLE ONCE.  . halobetasol (ULTRAVATE) 0.05 % cream APPLY TOPICALLY TO AFFECTED ITCHY AREAS 2 TIMES DAILY AS NEEDED  . montelukast (SINGULAIR) 10 MG tablet TAKE 1 TABLET BY MOUTH AT BEDTIME  . pantoprazole (PROTONIX) 20 MG tablet TAKE 1 TABLET BY MOUTH EVERY DAY  . triamcinolone (NASACORT ALLERGY 24HR) 55 MCG/ACT AERO nasal inhaler Place 2 sprays into the nose daily.   No facility-administered encounter medications on file as of 04/24/2017.     Activities of Daily Living In your present state of health, do you have any difficulty performing the following activities: 04/24/2017  Hearing? N  Vision? N  Difficulty concentrating or making decisions? N  Walking or climbing stairs? N  Dressing or bathing? N  Doing errands, shopping? N  Preparing Food and eating ? N  Using the Toilet? N  In the past six months, have you accidently leaked urine? N  Do you have problems with loss of bowel control? N  Managing your Medications? N  Managing your Finances? N  Housekeeping or managing your Housekeeping? N  Some recent data might be hidden    Patient Care Team: Jinny Sanders, MD as PCP - General (Family Medicine)  Assessment:     Hearing Screening   125Hz  250Hz  500Hz  1000Hz  2000Hz  3000Hz  4000Hz  6000Hz  8000Hz   Right ear:   40 40 40  40    Left ear:   40 40 40  40      Visual Acuity Screening   Right eye Left eye Both eyes  Without correction: 20/40 20/40 20/40   With correction:       Exercise Activities and Dietary recommendations Current Exercise Habits: Home exercise routine, Type of exercise: treadmill;strength training/weights, Time (Minutes): 50, Frequency (Times/Week): 1, Weekly Exercise (Minutes/Week): 50, Intensity: Mild, Exercise limited by: None identified  Goals    . DIET - INCREASE WATER INTAKE     Starting 04/24/2017, I will continue to drink at least 6-8 glasses of water daily.       Fall Risk Fall Risk  04/24/2017 03/17/2016    Falls in the past year? No Yes  Number falls in past yr: - 2 or more  Injury with Fall? - Yes   Depression Screen PHQ 2/9 Scores 04/24/2017 03/17/2016  PHQ - 2 Score 0 0  PHQ- 9 Score 0 -     Cognitive Function MMSE - Mini Mental State Exam 04/24/2017  Orientation to time 5  Orientation to Place 5  Registration 3  Attention/ Calculation 0  Recall 3  Language- name 2 objects 0  Language- repeat 1  Language- follow 3 step command 3  Language- read & follow direction 0  Write a sentence 0  Copy design 0  Total score 20     PLEASE NOTE: A Mini-Cog screen was completed. Maximum score is 20. A value of 0 denotes this part of Folstein MMSE was not completed or the patient failed this part of the Mini-Cog screening.   Mini-Cog Screening Orientation to Time - Max 5 pts Orientation to Place - Max 5 pts Registration - Max 3 pts Recall - Max 3 pts Language Repeat - Max 1 pts Language Follow 3 Step Command - Max 3 pts     Immunization History  Administered Date(s) Administered  . Td 06/05/2001   Screening Tests Health Maintenance  Topic Date Due  . INFLUENZA VACCINE  09/02/2017 (Originally 01/03/2017)  . MAMMOGRAM  06/04/2018 (Originally 11/15/2006)  . COLON CANCER SCREENING ANNUAL FOBT  04/12/2019 (Originally 02/26/2016)  . PAP SMEAR  03/18/2019  . Fecal DNA (Cologuard)  04/12/2019  . Hepatitis C Screening  Addressed      Plan:     I have personally reviewed, addressed, and noted the following in the patient's chart:  A. Medical and social history B. Use of alcohol, tobacco or illicit drugs  C. Current medications and supplements D. Functional ability and status E.  Nutritional status F.  Physical activity G. Advance directives H. List of other physicians I.  Hospitalizations, surgeries, and ER visits in previous 12 months J.  [place] to include hearing, vision, cognitive, depression L. Referrals and appointments - none  In addition, I have reviewed  and discussed with patient certain preventive protocols, quality metrics, and best practice recommendations. A written personalized care plan for preventive services as well as general preventive health recommendations were provided to patient.  See attached scanned questionnaire for additional information.   Signed,   Lindell Noe, MHA, BS, LPN Health Coach

## 2017-04-24 NOTE — Progress Notes (Signed)
Pre visit review using our clinic review tool, if applicable. No additional management support is needed unless otherwise documented below in the visit note. 

## 2017-04-24 NOTE — Progress Notes (Signed)
I reviewed health advisor's note, was available for consultation, and agree with documentation and plan.   Signed,  Leota Maka T. Bryan Goin, MD  

## 2017-04-24 NOTE — Patient Instructions (Addendum)
Crystal Gonzalez , Thank you for taking time to come for your Medicare Wellness Visit. I appreciate your ongoing commitment to your health goals. Please review the following plan we discussed and let me know if I can assist you in the future.   These are the goals we discussed: Goals    . DIET - INCREASE WATER INTAKE     Starting 04/24/2017, I will continue to drink at least 6-8 glasses of water daily.        This is a list of the screening recommended for you and due dates:  Health Maintenance  Topic Date Due  . Flu Shot  09/02/2017*  . Mammogram  06/04/2018*  . Stool Blood Test  04/12/2019*  . Pap Smear  03/18/2019  . Cologuard (Stool DNA test)  04/12/2019  .  Hepatitis C: One time screening is recommended by Center for Disease Control  (CDC) for  adults born from [birth year] through 1965.   Addressed  *Topic was postponed. The date shown is not the original due date.   Preventive Care for Adults  A healthy lifestyle and preventive care can promote health and wellness. Preventive health guidelines for adults include the following key practices.  . A routine yearly physical is a good way to check with your health care provider about your health and preventive screening. It is a chance to share any concerns and updates on your health and to receive a thorough exam.  . Visit your dentist for a routine exam and preventive care every 6 months. Brush your teeth twice a day and floss once a day. Good oral hygiene prevents tooth decay and gum disease.  . The frequency of eye exams is based on your age, health, family medical history, use  of contact lenses, and other factors. Follow your health care provider's recommendations for frequency of eye exams.  . Eat a healthy diet. Foods like vegetables, fruits, whole grains, low-fat dairy products, and lean protein foods contain the nutrients you need without too many calories. Decrease your intake of foods high in solid fats, added sugars, and salt. Eat  the right amount of calories for you. Get information about a proper diet from your health care provider, if necessary.  . Regular physical exercise is one of the most important things you can do for your health. Most adults should get at least 150 minutes of moderate-intensity exercise (any activity that increases your heart rate and causes you to sweat) each week. In addition, most adults need muscle-strengthening exercises on 2 or more days a week.  Silver Sneakers may be a benefit available to you. To determine eligibility, you may visit the website: www.silversneakers.com or contact program at [phone] Mon-Fri between 8AM-8PM.   . Maintain a healthy weight. The body mass index (BMI) is a screening tool to identify possible weight problems. It provides an estimate of body fat based on height and weight. Your health care provider can find your BMI and can help you achieve or maintain a healthy weight.   For adults 20 years and older: ? A BMI below 18.5 is considered underweight. ? A BMI of 18.5 to 24.9 is normal. ? A BMI of 25 to 29.9 is considered overweight. ? A BMI of 30 and above is considered obese.   . Maintain normal blood lipids and cholesterol levels by exercising and minimizing your intake of saturated fat. Eat a balanced diet with plenty of fruit and vegetables. Blood tests for lipids and cholesterol should begin  at age 59 and be repeated every 5 years. If your lipid or cholesterol levels are high, you are over 50, or you are at high risk for heart disease, you may need your cholesterol levels checked more frequently. Ongoing high lipid and cholesterol levels should be treated with medicines if diet and exercise are not working.  . If you smoke, find out from your health care provider how to quit. If you do not use tobacco, please do not start.  . If you choose to drink alcohol, please do not consume more than 2 drinks per day. One drink is considered to be 12 ounces (355 mL)  of beer, 5 ounces (148 mL) of wine, or 1.5 ounces (44 mL) of liquor.  . If you are 65-7 years old, ask your health care provider if you should take aspirin to prevent strokes.  . Use sunscreen. Apply sunscreen liberally and repeatedly throughout the day. You should seek shade when your shadow is shorter than you. Protect yourself by wearing long sleeves, pants, a wide-brimmed hat, and sunglasses year round, whenever you are outdoors.  . Once a month, do a whole body skin exam, using a mirror to look at the skin on your back. Tell your health care provider of new moles, moles that have irregular borders, moles that are larger than a pencil eraser, or moles that have changed in shape or color.

## 2017-04-24 NOTE — Progress Notes (Signed)
PCP notes:   Health maintenance:  Mammogram - addressed  Abnormal screenings:   None  Patient concerns:   Patient wants to discuss having labs completed to determine if she can resume intake of other meat products like beef and pork.   Nurse concerns:  None  Next PCP appt:   05/01/17 @ 1515

## 2017-05-01 ENCOUNTER — Other Ambulatory Visit: Payer: Self-pay

## 2017-05-01 ENCOUNTER — Ambulatory Visit (INDEPENDENT_AMBULATORY_CARE_PROVIDER_SITE_OTHER): Payer: Medicare Other | Admitting: Family Medicine

## 2017-05-01 ENCOUNTER — Encounter: Payer: Self-pay | Admitting: Family Medicine

## 2017-05-01 VITALS — BP 120/56 | HR 76 | Temp 97.7°F | Ht 59.5 in | Wt 188.5 lb

## 2017-05-01 DIAGNOSIS — J4541 Moderate persistent asthma with (acute) exacerbation: Secondary | ICD-10-CM

## 2017-05-01 DIAGNOSIS — L982 Febrile neutrophilic dermatosis [Sweet]: Secondary | ICD-10-CM | POA: Diagnosis not present

## 2017-05-01 DIAGNOSIS — E781 Pure hyperglyceridemia: Secondary | ICD-10-CM | POA: Diagnosis not present

## 2017-05-01 DIAGNOSIS — R21 Rash and other nonspecific skin eruption: Secondary | ICD-10-CM

## 2017-05-01 DIAGNOSIS — Z0000 Encounter for general adult medical examination without abnormal findings: Secondary | ICD-10-CM | POA: Diagnosis not present

## 2017-05-01 NOTE — Progress Notes (Signed)
Subjective:    Patient ID: Crystal Gonzalez, female    DOB: May 29, 1956, 61 y.o.   MRN: [id number]  HPI  The patient presents for  complete physical and review of chronic health problems.   The patient saw Candis Musa, LPN for medicare wellness. Note reviewed in detail and important notes copied below. Health maintenance: Mammogram - addressed Abnormal screenings:  None Patient concerns:  Patient wants to discuss having labs completed to determine if she can resume intake of other meat products like beef and pork.   05/01/17 Today;   Moderate persistent asthma: stable control. No hospitalizations.  Uisng advair, singulair and nasacort. Not using albuterol lately  Elevated Cholesterol:  HDL high and LDL decreaing. Lab Results  Component Value Date   CHOL 184 04/24/2017   HDL 66.70 04/24/2017   LDLCALC 108 (H) 04/24/2017   TRIG 49.0 04/24/2017   CHOLHDL 3 04/24/2017  Diet compliance: good Exercise: minimal, working in yard Other complaints: Body mass index is 37.44 kg/m.   Social History /Family History/Past Medical History reviewed in detail and updated in EMR if needed. Blood pressure (!) 120/56, pulse 76, temperature 97.7 F (36.5 C), temperature source Oral, height 4' 11.5" (1.511 m), weight 188 lb 8 oz (85.5 kg).   Review of Systems  Constitutional: Negative for fatigue and fever.  HENT: Negative for congestion and ear pain.   Eyes: Negative for pain.  Respiratory: Negative for cough, chest tightness and shortness of breath.   Cardiovascular: Negative for chest pain, palpitations and leg swelling.  Gastrointestinal: Negative for abdominal pain.  Genitourinary: Negative for dysuria and vaginal bleeding.  Musculoskeletal: Positive for arthralgias and back pain.  Skin: Positive for rash.  Neurological: Negative for syncope, light-headedness and headaches.  Psychiatric/Behavioral: Negative for dysphoric mood.       Objective:   Physical Exam    Constitutional: Vital signs are normal. She appears well-developed and well-nourished. She is cooperative.  Non-toxic appearance. She does not appear ill. No distress.  HENT:  Head: Normocephalic.  Right Ear: Hearing, tympanic membrane, external ear and ear canal normal.  Left Ear: Hearing, tympanic membrane, external ear and ear canal normal.  Nose: Nose normal.  Eyes: Conjunctivae, EOM and lids are normal. Pupils are equal, round, and reactive to light. Lids are everted and swept, no foreign bodies found.  Neck: Trachea normal and normal range of motion. Neck supple. Carotid bruit is not present. No thyroid mass and no thyromegaly present.  Cardiovascular: Normal rate, regular rhythm, S1 normal, S2 normal, normal heart sounds and intact distal pulses. Exam reveals no gallop.  No murmur heard. Pulmonary/Chest: Effort normal and breath sounds normal. No respiratory distress. She has no wheezes. She has no rhonchi. She has no rales.  Abdominal: Soft. Normal appearance and bowel sounds are normal. She exhibits no distension, no fluid wave, no abdominal bruit and no mass. There is no hepatosplenomegaly. There is no tenderness. There is no rebound, no guarding and no CVA tenderness. No hernia.  Lymphadenopathy:    She has no cervical adenopathy.    She has no axillary adenopathy.  Neurological: She is alert. She has normal strength. No cranial nerve deficit or sensory deficit.  Skin: Skin is warm, dry and intact. No rash noted.  Psychiatric: Her speech is normal and behavior is normal. Judgment normal. Her mood appears not anxious. Cognition and memory are normal. She does not exhibit a depressed mood.          Assessment & Plan:  The  patient's preventative maintenance and recommended screening tests for an annual wellness exam were reviewed in full today. Brought up to date unless services declined.  Counselled on the importance of diet, exercise, and its role in overall health and  mortality. The patient's FH and SH was reviewed, including their home life, tobacco status, and drug and alcohol status.   Last mammogram 8 years ago. Due now. Last pelvic exam: PAP nml, neg HPV repeat in 5 years.. Colonoscopy: 2003 nml,  cologuard 2017.. repeat due in 2020 Vaccines: Due Tdap but she is allergic. Refused flu. Nonsmoker Hep C: neg HIV: refused.

## 2017-05-01 NOTE — Assessment & Plan Note (Signed)
Stable on current regimen   

## 2017-05-01 NOTE — Assessment & Plan Note (Signed)
Return to derm for treatment options.

## 2017-05-01 NOTE — Assessment & Plan Note (Signed)
Improving control refuses medication to treat. Encouraged exercise, weight loss, healthy eating habits.

## 2017-05-01 NOTE — Patient Instructions (Addendum)
Schedule yearly mammogram when able.  Return to dermatology for questions about skin.  Return if continued issues with hip.

## 2017-05-10 ENCOUNTER — Other Ambulatory Visit: Payer: Self-pay | Admitting: Family Medicine

## 2017-05-22 ENCOUNTER — Encounter: Payer: Self-pay | Admitting: Internal Medicine

## 2017-05-22 ENCOUNTER — Ambulatory Visit (INDEPENDENT_AMBULATORY_CARE_PROVIDER_SITE_OTHER): Payer: Medicare Other | Admitting: Internal Medicine

## 2017-05-22 VITALS — BP 152/88 | HR 71 | Temp 98.0°F | Wt 187.8 lb

## 2017-05-22 DIAGNOSIS — J0110 Acute frontal sinusitis, unspecified: Secondary | ICD-10-CM | POA: Insufficient documentation

## 2017-05-22 MED ORDER — AMOXICILLIN 500 MG PO TABS: 1000.0000 mg | ORAL_TABLET | Freq: Two times a day (BID) | ORAL | 0 refills | Status: AC

## 2017-05-22 NOTE — Progress Notes (Signed)
Subjective:    Patient ID: Crystal Gonzalez, female    DOB: [birth year], 61 y.o.   MRN: [id number]  HPI Here due to respiratory symptoms  Has been sick since here for PE 2 weeks ago Head congestion, tickle in throat Worsening head pressure---dark green sputum (seems to be from nasal drip) Green nasal discharge Worse at night  Slight fever, goes from freezing to hot (?illness or menopause) Some SOB---feels it in lungs (some wheezing especially at night) Hasn't been using the rescue inhaler  Using robitussin DM-- some help  Current Outpatient Medications on File Prior to Visit  Medication Sig Dispense Refill  . acetaminophen (TYLENOL) 500 MG tablet Take 750 mg by mouth as needed.    Marland Kitchen ADVAIR DISKUS 250-50 MCG/DOSE AEPB INHALE 1 PUFF INTO THE LUNGS TWICE DAILY 180 each 0  . albuterol (PROVENTIL HFA;VENTOLIN HFA) 108 (90 BASE) MCG/ACT inhaler Inhale 2 puffs into the lungs every 6 (six) hours as needed for wheezing or shortness of breath. 1 Inhaler 0  . albuterol (PROVENTIL) (2.5 MG/3ML) 0.083% nebulizer solution Take 3 mLs (2.5 mg total) by nebulization every 6 (six) hours as needed for wheezing or shortness of breath. 150 mL 1  . EPIPEN 2-PAK 0.3 MG/0.3ML SOAJ injection INJECT 0.3 MLS (0.3 MG TOTAL) INTO THE MUSCLE ONCE.  0  . halobetasol (ULTRAVATE) 0.05 % cream APPLY TOPICALLY TO AFFECTED ITCHY AREAS 2 TIMES DAILY AS NEEDED 50 g 0  . montelukast (SINGULAIR) 10 MG tablet TAKE 1 TABLET BY MOUTH AT BEDTIME 90 tablet 3  . pantoprazole (PROTONIX) 20 MG tablet TAKE 1 TABLET BY MOUTH EVERY DAY 90 tablet 0  . triamcinolone (NASACORT ALLERGY 24HR) 55 MCG/ACT AERO nasal inhaler Place 2 sprays into the nose daily. 3 Inhaler 1   No current facility-administered medications on file prior to visit.     Allergies  Allergen Reactions  . Iodine   . Peanut-Containing Drug Products   . Tetanus Toxoid   . Prednisone Rash    Past Medical History:  Diagnosis Date  . Asthma   . Hemorrhoids   .  UTI (lower urinary tract infection)     Past Surgical History:  Procedure Laterality Date  . BREAST BIOPSY    . KNEE SURGERY     x 5 surgeries  . MYOMECTOMY      Family History  Problem Relation Age of Onset  . Stroke Mother   . Hypertension Mother   . Heart disease Mother 60       massive MI  . Hypertension Father   . Heart disease Father   . Prostate cancer Father   . Breast cancer Maternal Aunt   . Cancer Maternal Aunt 60       breast cancer    Social History   Socioeconomic History  . Marital status: Widowed    Spouse name: Not on file  . Number of children: Not on file  . Years of education: Not on file  . Highest education level: Not on file  Social Needs  . Financial resource strain: Not on file  . Food insecurity - worry: Not on file  . Food insecurity - inability: Not on file  . Transportation needs - medical: Not on file  . Transportation needs - non-medical: Not on file  Occupational History  . Not on file  Tobacco Use  . Smoking status: Former Smoker    Types: Cigarettes  . Smokeless tobacco: Never Used  Substance and Sexual Activity  . Alcohol  use: Yes    Alcohol/week: 0.0 oz    Comment: occ  . Drug use: No  . Sexual activity: Not on file  Other Topics Concern  . Not on file  Social History Narrative  . Not on file   Review of Systems Some rash on arms----goes back to tick bite and reaction to bandaid, etc No vomiting  Loose stool 2 days ago Appetite is okay    Objective:   Physical Exam  Constitutional: No distress.  HENT:  Mild frontal tenderness Moderate nasal inflammation TMs not inflamed Slight pharyngeal injection  Neck: No thyromegaly present.  Pulmonary/Chest: Effort normal and breath sounds normal. No respiratory distress. She has no wheezes. She has no rales.  Lymphadenopathy:    She has no cervical adenopathy.          Assessment & Plan:

## 2017-05-22 NOTE — Assessment & Plan Note (Signed)
Symptoms over 2 weeks Purulent drainage No exacerbation of asthma Will try amoxil Use rescue inhaler at night

## 2017-06-06 ENCOUNTER — Other Ambulatory Visit: Payer: Self-pay | Admitting: Family Medicine

## 2017-06-07 ENCOUNTER — Encounter: Payer: Self-pay | Admitting: Allergy & Immunology

## 2017-06-07 ENCOUNTER — Ambulatory Visit (INDEPENDENT_AMBULATORY_CARE_PROVIDER_SITE_OTHER): Payer: Medicare Other | Admitting: Allergy & Immunology

## 2017-06-07 VITALS — BP 112/80 | HR 73 | Ht 59.0 in | Wt 188.0 lb

## 2017-06-07 DIAGNOSIS — L239 Allergic contact dermatitis, unspecified cause: Secondary | ICD-10-CM

## 2017-06-07 DIAGNOSIS — T781XXD Other adverse food reactions, not elsewhere classified, subsequent encounter: Secondary | ICD-10-CM

## 2017-06-07 DIAGNOSIS — J310 Chronic rhinitis: Secondary | ICD-10-CM | POA: Diagnosis not present

## 2017-06-07 DIAGNOSIS — J4540 Moderate persistent asthma, uncomplicated: Secondary | ICD-10-CM

## 2017-06-07 NOTE — Progress Notes (Signed)
NEW PATIENT  Date of Service/Encounter:  06/07/17  Referring provider: Jinny Sanders, MD   Assessment:   Moderate persistent asthma without complication  Adverse food reaction - concern for alpha gal sensitivity  Chronic rhinitis  Likely allergic contact dermatitis  Plan/Recommendations:   1. Moderate persistent asthma without complication - Lung testing looked good today. - We will not make any changes at this time. - She has been stable on Advair, therefore we can consider stepping down her controller at some point.  - Daily controller medication(s): Singulair 10mg  daily and Advair 250/73mcg one puff twice daily - Prior to physical activity: ProAir 2 puffs 10-15 minutes before physical activity. - Rescue medications: ProAir 4 puffs every 4-6 hours as needed - Asthma control goals:  * Full participation in all desired activities (may need albuterol before activity) * Albuterol use two time or less a week on average (not counting use with activity) * Cough interfering with sleep two time or less a month * Oral steroids no more than once a year * No hospitalizations  2. Adverse food reaction - We will get an alpha gal panel to check for the alpha gal sensitivity. - Continue to avoid peanuts and radishes.   3. Chronic rhinitis - We will get blood work to look for allergies.  - Continue with the Nasacort once daily as needed. - I am not sure that obtaining records for [facility] will be helpful.   4. Allergic contact dermatitis - We will give you samples of Eucrisa.  - We will schedule you for patch testing (Monday/Wednesday/Friday).  - This might point to any triggers for her rash. 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   5. Return in about 4 weeks (around 07/02/2017) for [facility] TESTING.  Subjective:   Crystal Gonzalez is a 62 y.o. female presenting today for evaluation of  Chief Complaint  Patient presents with  . Rash   Pt presents to have alpha gal labs drawn to check to see if this could be the reason for rash.    Crystal Gonzalez has a history of the following: Patient Active Problem List   Diagnosis Date Noted  . Acute non-recurrent frontal sinusitis 05/22/2017  . 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 TIA (transient ischemic attack) 07/03/2013  . Hyperlipidemia 01/13/2009  . Allergic rhinitis 01/13/2009  . Asthma, moderate persistent 01/13/2009  . G E R D 01/13/2009    History obtained from: chart review and the patient, who is a poor historian.  Crystal Gonzalez was referred by Jinny Sanders, MD.     Crystal Gonzalez is a 62 y.o. female presenting for an evaluation of a multitude of symptoms, including adverse food reactions and asthma. She is most concerned with alpha-gal syndrome, however, and would like to "eat a bacon cheeseburger".   Asthma/Respiratory Symptom History: She was diagnosed with asthma for her entire life. She is currently on Advair 250/50 one puff BID as well as montelukast. She was started on Advair years ago. She moved down here in 2008 from [place]. She has never used her rescue inhaler at all. She does report coughing at night when she has colds. Since she has been down here, she does not think that she has needed prednisone at all for her breathing. Her Advair dosing has never changed since she has been living here in [place] (ten years now).   Reflux Symptom History: She had GERD and is on pantoprazole.  She has been on this for 40 years or so. She is now on 20mg  daily. She has tried getting off of it, but her abdominal pain recurs.   Allergic Rhinitis Symptom History: She does endorse some sneezing occasionally. She does have Nasacort which she uses over the counter. She does not use it every day. She was seen at [facility] in the distant past after she moved here initially. She was not on allergy shots.  Food Allergy Symptom History: She was bit by a tick in 2016. The  history from there is rather disjointed. She developed erythema migrans shortly after. She also had multiple other tick bites following this first exposure. She did have antibiotics. She did notice a Courtright dot on the back. She did have some urticaria following the bite. 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 However, she does tell me that the Dermatologist did give her "some pills". She reports marked pruritis with the rash. She was tested for Lyme disease and this was negative. At the time, she has had reactions to red meat with worsening urticaria. The history is all over the place and she has a difficult time describing a clear concise history. 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 She is quite vague about the symptoms.   She does have a history of a left knee injury in 1998. She fell off of a ladder 20 feet and has had five surgeries in total. She is on disability from this since that time. 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   Otherwise, there is no history of other atopic diseases, including drug allergies, stinging insect allergies, or urticaria. There is no significant infectious history. Vaccinations are up to date.    Past Medical History: Patient Active Problem List   Diagnosis Date Noted  . Acute non-recurrent frontal sinusitis 05/22/2017  . 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 TIA (transient ischemic attack) 07/03/2013  . Hyperlipidemia 01/13/2009  . Allergic rhinitis 01/13/2009  . Asthma, moderate persistent 01/13/2009  . G E R D 01/13/2009    Medication List:  Allergies as of 06/07/2017      Reactions   Iodine    Peanut-containing Drug Products    Tetanus Toxoid    Prednisone Rash      Medication List        Accurate as of 06/07/17  4:14 PM. Always use your most recent med list.            acetaminophen 500 MG tablet Commonly known as:  TYLENOL Take 750 mg by mouth as needed.   ADVAIR DISKUS 250-50 MCG/DOSE Aepb Generic drug:  Fluticasone-Salmeterol INHALE 1 PUFF INTO THE LUNGS TWICE DAILY   albuterol (2.5 MG/3ML) 0.083% nebulizer solution Commonly known as:  PROVENTIL Take 3 mLs (2.5 mg total) by nebulization every 6 (six) hours as needed for wheezing or shortness of breath.   albuterol 108 (90 Base) MCG/ACT inhaler Commonly known as:  PROVENTIL HFA;VENTOLIN HFA Inhale 2 puffs into the lungs every 6 (six) hours as needed for wheezing or shortness of breath.   EPIPEN 2-PAK 0.3 mg/0.3 mL Soaj injection Generic drug:  EPINEPHrine INJECT 0.3 MLS (0.3 MG TOTAL) INTO THE MUSCLE ONCE.   halobetasol 0.05 % cream Commonly known as:  ULTRAVATE APPLY TOPICALLY TO AFFECTED ITCHY AREAS 2 TIMES DAILY AS  NEEDED   montelukast 10 MG tablet Commonly known as:  SINGULAIR TAKE 1 TABLET BY MOUTH AT BEDTIME   pantoprazole 20 MG tablet Commonly known as:  PROTONIX TAKE 1 TABLET BY MOUTH EVERY DAY   triamcinolone 55 MCG/ACT Aero nasal inhaler Commonly known as:  NASACORT ALLERGY 24HR Place 2 sprays into the nose daily.       Birth History: non-contributory.   Developmental History: non-contributory.   Past Surgical History: Past Surgical History:  Procedure Laterality Date  . BREAST BIOPSY    . KNEE SURGERY     x 5 surgeries  . MYOMECTOMY       Family History: Family History  Problem Relation Age of Onset  . Stroke Mother   . Hypertension Mother   . Heart disease Mother 78       massive MI  . Hypertension Father   . Heart disease Father   . Prostate cancer Father   . Breast cancer Maternal Aunt   . Cancer Maternal Aunt 40       breast cancer     Social History: Kaylianna lives at home by herself. She lives in a house with hardwoods in the main living areas and the bedrooms. She has two indoor cats and one outdoor cat. There are dust mite coverings on the  bedding. There is no tobacco exposure. She thinks that all of her reactions occurred when she moved into her current home.      Review of Systems: a 14-point review of systems is pertinent for what is mentioned in HPI.  Otherwise, all other systems were negative. Constitutional: negative other than that listed in the HPI Eyes: negative other than that listed in the HPI Ears, nose, mouth, throat, and face: negative other than that listed in the HPI Respiratory: negative other than that listed in the HPI Cardiovascular: negative other than that listed in the HPI Gastrointestinal: negative other than that listed in the HPI Genitourinary: negative other than that listed in the HPI Integument: negative other than that listed in the HPI Hematologic: negative other than that listed in the HPI Musculoskeletal: negative other than that listed in the HPI Neurological: negative other than that listed in the HPI Allergy/Immunologic: negative other than that listed in the HPI    Objective:   Blood pressure 112/80, pulse 73, height 4\' 11"  (1.499 m), weight 188 lb (85.3 kg), SpO2 96 %. Body mass index is 37.97 kg/m.   Physical Exam:  General: Alert, interactive, in no acute distress. Eyes: No conjunctival injection bilaterally, no discharge on the right, no discharge on the left and no Horner-Trantas dots present. PERRL bilaterally. EOMI without pain. No photophobia.  Ears: Right TM pearly gray with normal light reflex, Left TM pearly gray with normal light reflex, Right TM intact without perforation and Left TM intact without perforation.  Nose/Throat: External nose within normal limits, nasal crease present and septum midline. Turbinates edematous and pale with clear discharge. Posterior oropharynx erythematous without cobblestoning in the posterior oropharynx. Tonsils 2+ without exudates.  Tongue without thrush. Neck: Supple without thyromegaly. Trachea midline. Adenopathy: no enlarged lymph  nodes appreciated in the anterior cervical, occipital, axillary, epitrochlear, inguinal, or popliteal regions. Lungs: Clear to auscultation without wheezing, rhonchi or rales. No increased work of breathing. CV: Normal S1/S2. No murmurs. Capillary refill <2 seconds.  Abdomen: Nondistended, nontender. No guarding or rebound tenderness. Bowel sounds present in all fields and hypoactive  Skin: Dry, erythematous, excoriated patches on the bilateral arms and legs  with some crusting noted. These papular lesions are noted in clusters. Extremities:  No clubbing, cyanosis or edema. Neuro:   Grossly intact. No focal deficits appreciated. Responsive to questions.   Diagnostic studies:   Spirometry: results normal (FEV1: 1.47/70%, FVC: 2.25/83%, FEV1/FVC: 65%).    Spirometry consistent with mild obstructive disease.   Allergy Studies: none    Salvatore Marvel, MD [facility]

## 2017-06-07 NOTE — Patient Instructions (Addendum)
1. Moderate persistent asthma without complication - Lung testing looked good today. - We will not make any changes at this time. - Daily controller medication(s): Singulair 10mg  daily and Advair 250/35mcg one puff twice daily - Prior to physical activity: ProAir 2 puffs 10-15 minutes before physical activity. - Rescue medications: ProAir 4 puffs every 4-6 hours as needed - Asthma control goals:  * Full participation in all desired activities (may need albuterol before activity) * Albuterol use two time or less a week on average (not counting use with activity) * Cough interfering with sleep two time or less a month * Oral steroids no more than once a year * No hospitalizations  2. Adverse food reaction - We will get an alpha gal panel to check for the alpha gal sensitivity. - Continue to avoid peanuts and radishes.   3. Chronic rhinitis - We will get blood work to look for allergies.  - Continue with the Nasacort once daily as needed.  4. Allergic contact dermatitis - We will give you samples of Eucrisa.  - We will schedule you for patch testing (Monday/Wednesday/Friday).  5. Return in about 4 weeks (around 07/02/2017) for [facility] TESTING.    Please inform us of any Emergency Department visits, hospitalizations, or changes in symptoms. Call us before going to the ED for breathing or allergy symptoms since we might be able to fit you in for a sick visit. Feel free to contact us anytime with any questions, problems, or concerns.  It was a pleasure to meet you today! Happy New Year!   Websites that have reliable patient information: 1. American Academy of Asthma, Allergy, and Immunology: www.aaaai.org 2. Food Allergy Research and Education (FARE): foodallergy.org 3. Mothers of Asthmatics: http://www.asthmacommunitynetwork.org 4. American College of Allergy, Asthma, and Immunology: www.acaai.org

## 2017-06-12 ENCOUNTER — Encounter: Payer: Self-pay | Admitting: Allergy & Immunology

## 2017-06-12 ENCOUNTER — Telehealth: Payer: Self-pay

## 2017-06-12 LAB — IGE+ALLERGENS ZONE 2(30)
Alternaria Alternata IgE: <0.1 kU/L
Amer Sycamore IgE Qn: <0.1 kU/L
Bahia Grass IgE: 2.07 kU/L — AB
Bermuda Grass IgE: 1.1 kU/L — AB
Cedar, Mountain IgE: <0.1 kU/L
Cladosporium Herbarum IgE: <0.1 kU/L
D Farinae IgE: <0.1 kU/L
D Pteronyssinus IgE: <0.1 kU/L
E001-IGE CAT DANDER: 21.1 kU/L — AB
E005-IGE DOG DANDER: 0.97 kU/L — AB
IgE (Immunoglobulin E), Serum: 164 IU/mL — ABNORMAL HIGH (ref 0–100)
Johnson Grass IgE: 1.67 kU/L — AB
M003-IGE ASPERGILLUS FUMIGATUS: 0.17 kU/L — AB
Nettle IgE: <0.1 kU/L
Oak, White IgE: <0.1 kU/L
Penicillium Chrysogen IgE: <0.1 kU/L
Plantain, English IgE: <0.1 kU/L
Ragweed, Short IgE: 0.31 kU/L — AB
Stemphylium Herbarum IgE: <0.1 kU/L
Timothy Grass IgE: 5.64 kU/L — AB
White Mulberry IgE: <0.1 kU/L

## 2017-06-12 LAB — ALPHA-GAL PANEL
Alpha Gal IgE*: <0.1 kU/L (ref <0.10)
BEEF CLASS INTERPRETATION: 0
Class Interpretation: 0
PORK CLASS INTERPRETATION: 0
Pork (Sus spp) IgE: <0.1 kU/L (ref <0.35)

## 2017-06-12 LAB — IGE PEANUT COMPONENT PROFILE
F422-IgE Ara h 1: <0.1 kU/L
F423-IgE Ara h 2: 0.2 kU/L — AB
F427-IgE Ara h 9: <0.1 kU/L
F447-IGE ARA H 6: 0.52 kU/L — AB

## 2017-06-12 NOTE — Telephone Encounter (Signed)
I have reviewed the lab results and recommendations that Dr. Ernst Bowler had with the patient. She was very adamant that she is highly allergic to peanuts and is going to avoid those at all cost. I am mailing avoidance measures now.

## 2017-06-13 ENCOUNTER — Telehealth: Payer: Self-pay | Admitting: Family Medicine

## 2017-06-13 NOTE — Telephone Encounter (Signed)
Copied from [place] [id number]. Topic: Quick Communication - See Telephone Encounter >> Jun 13, 2017 10:31 AM Ahmed Prima L wrote: CRM for notification. See Telephone encounter for:   06/13/17.  Pt is requesting lab results. Please call back, said she missed call from office. No documentation.  [phone]

## 2017-06-28 ENCOUNTER — Telehealth: Payer: Self-pay | Admitting: Allergy & Immunology

## 2017-06-28 NOTE — Telephone Encounter (Signed)
Patient would like her blood test results mailed to her.

## 2017-06-28 NOTE — Telephone Encounter (Signed)
Called and informed patient that her lab results have been mailed.

## 2017-07-02 ENCOUNTER — Ambulatory Visit: Payer: Medicare Other | Admitting: Allergy & Immunology

## 2017-08-27 ENCOUNTER — Other Ambulatory Visit: Payer: Self-pay | Admitting: Family Medicine

## 2017-08-29 ENCOUNTER — Telehealth: Payer: Self-pay | Admitting: Family Medicine

## 2017-08-29 NOTE — Telephone Encounter (Signed)
Please see message below re: medication

## 2017-08-29 NOTE — Telephone Encounter (Addendum)
A 90 day supply ( #180 with 1 refill)  was sent to [facility] on Monday 08/27/2017.  [place] [address] notified.

## 2017-08-29 NOTE — Telephone Encounter (Signed)
Will route to office for review. 

## 2017-08-29 NOTE — Telephone Encounter (Signed)
Copied from [place] [id number]. Topic: Quick Communication - See Telephone Encounter >> Aug 29, 2017  2:17 PM Antonieta Iba C wrote: CRM for notification. See Telephone encounter for: 08/29/17.  Pt called in because she only received a month supply on her ADVAIR DISKUS 250-50 MCG/DOSE AEPB, pt says that she should have a 3 month supply. Pt would like to have this corrected and sent in to her pharmacy.

## 2017-10-12 ENCOUNTER — Ambulatory Visit: Payer: Self-pay | Admitting: *Deleted

## 2017-10-12 NOTE — Telephone Encounter (Signed)
Pt stating that she feels like she has a head cold that is going down into her lungs. Pt states that she has a scratchy/sore throat, congestion in head, irritation in eyes and productive cough with light green foamy sputum that she began to experience on yesterday.Pt states she has been around a friend that had a cold about 3 weeks ago and thinks she may have gotten sick from being around them. Pt states she does not believe that her symptoms are related to pollen or allergies. Pt states she some nausea last nigh and this morning and had one episode of emesis today. Pt states that she gets choked from coughing which makes her feel like she has to throw up. Pt states she had a prescription for Amoxicillin that was prescribed 05/22/17 and had approximately 3 pills remaining in the bottle and wanted to know if she should take the remaining pills to treat current symptoms. Advised pt not to take the pills due to not being able to have the full course of antibiotics and that she should be seen by a provider before taking antibiotics. Pt states she has been taking Robitussin cough and congestion DM and Tylenol for current symptoms and uses cough drops to help with her sore throat. Pt requesting to make appt tomorrow at the [facility]. Pt scheduled for appt on 5/11 at 9 am. Pt advised to call the office back if she experienced difficulty breathing or if symptoms worsened. Pt verbalized understanding.  Reason for Disposition . SEVERE coughing spells (e.g., whooping sound after coughing, vomiting after coughing)  Protocols used: COUGH - ACUTE PRODUCTIVE-A-AH

## 2017-10-13 ENCOUNTER — Ambulatory Visit (INDEPENDENT_AMBULATORY_CARE_PROVIDER_SITE_OTHER): Payer: Medicare Other | Admitting: Family Medicine

## 2017-10-13 ENCOUNTER — Encounter: Payer: Self-pay | Admitting: Family Medicine

## 2017-10-13 VITALS — BP 130/80 | HR 81 | Temp 98.2°F | Resp 16 | Wt 193.0 lb

## 2017-10-13 DIAGNOSIS — J0100 Acute maxillary sinusitis, unspecified: Secondary | ICD-10-CM | POA: Diagnosis not present

## 2017-10-13 MED ORDER — AMOXICILLIN 875 MG PO TABS: 875.0000 mg | ORAL_TABLET | Freq: Two times a day (BID) | ORAL | 0 refills | Status: AC

## 2017-10-13 MED ORDER — GUAIFENESIN-CODEINE 100-10 MG/5ML PO SOLN: 5.0000 mL | Freq: Four times a day (QID) | ORAL | 0 refills | Status: DC | PRN

## 2017-10-13 NOTE — Progress Notes (Signed)
Subjective   CC:  Chief Complaint  Patient presents with  . URI    cold sxs, productive cough. has asthma    HPI: Crystal Gonzalez is a 62 y.o. female who presents to the office today to address the problems listed above in the chief complaint.  Patient reports sinus congestion and pressure with thick drainage, mild productive cough, ear pressure without pain, and mild malaise.  C/o PND  And worries infection will go to lungs. Had some wheezing on advair but not using rescue inhaler.  Symptoms have been present for several days. + sick contact with similar illness at home.  She denies high fevers, GI symptoms, shortness of breath. Shehas had sinus infections in the past and this feels similar.  Last was in Dec 2018. Patient is a non-smoker.  No history of asthma or COPD.  I reviewed the patients updated PMH, FH, and SocHx.    Patient Active Problem List   Diagnosis Date Noted  . Acute non-recurrent frontal sinusitis 05/22/2017  . 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 TIA (transient ischemic attack) 07/03/2013  . Hyperlipidemia 01/13/2009  . Allergic rhinitis 01/13/2009  . Asthma, moderate persistent 01/13/2009  . G E R D 01/13/2009   Current Meds  Medication Sig  . acetaminophen (TYLENOL) 500 MG tablet Take 750 mg by mouth as needed.  Marland Kitchen ADVAIR DISKUS 250-50 MCG/DOSE AEPB INHALE 1 PUFF INTO THE LUNGS TWICE DAILY  . albuterol (PROVENTIL HFA;VENTOLIN HFA) 108 (90 BASE) MCG/ACT inhaler Inhale 2 puffs into the lungs every 6 (six) hours as needed for wheezing or shortness of breath.  Marland Kitchen albuterol (PROVENTIL) (2.5 MG/3ML) 0.083% nebulizer solution Take 3 mLs (2.5 mg total) by nebulization every 6 (six) hours as needed for wheezing or shortness of breath.  . EPIPEN 2-PAK 0.3 MG/0.3ML SOAJ injection INJECT 0.3 MLS (0.3 MG TOTAL) INTO THE MUSCLE ONCE.  . halobetasol (ULTRAVATE) 0.05 % cream APPLY TOPICALLY TO AFFECTED ITCHY AREAS 2 TIMES DAILY AS NEEDED  . montelukast (SINGULAIR) 10 MG tablet TAKE 1  TABLET BY MOUTH AT BEDTIME  . pantoprazole (PROTONIX) 20 MG tablet TAKE 1 TABLET BY MOUTH EVERY DAY  . triamcinolone (NASACORT ALLERGY 24HR) 55 MCG/ACT AERO nasal inhaler Place 2 sprays into the nose daily.    Review of Systems: Cardiovascular: negative for chest pain Respiratory: negative for SOB or persistent cough Gastrointestinal: negative for abdominal pain Genitourinary: negative for dysuria or gross hematuria  Objective  Vitals: BP 130/80   Pulse 81   Temp 98.2 F (36.8 C) (Oral)   Resp 16   Wt 193 lb (87.5 kg)   SpO2 96%   BMI 38.98 kg/m  General: no acute distress  Psych:  Alert and oriented, normal mood and affect HEENT:  Normocephalic, atraumatic, TMs with serous effusions or retraction w/o erythema, nasal mucosa is red with purulent drainage, tender maxillary sinus present, OP mild erythematous w/o eudate, supple neck with LAD Cardiovascular:  RRR without murmur or gallop. no peripheral edema Respiratory:  Good breath sounds bilaterally, CTAB with normal respiratory effort, no wheezing or rales or rhonchi Skin:  Warm, no rashes Neurologic:   Mental status is normal. normal gait  Assessment  1. Acute non-recurrent maxillary sinusitis      Plan    Sinusitis: History and exam is most consistent with bacterial sinus infection.  Etiology and prognosis discussed with patient.  Recommend antibiotics as ordered below.  Monitor breating. Continue advair and use albuterol if needed. Cough meds prescirbed. rec degongestant. Patient to  complete course of antibiotics, use supportive medications like mucolytics and decongestants as needed.  May use Tylenol or Advil if needed.  Symptoms should improve over the next 2 weeks.  Patient will return or call if symptoms persist or worsen.  Follow up: prn    Commons side effects, risks, benefits, and alternatives for medications and treatment plan prescribed today were discussed, and the patient expressed understanding of the given  instructions. Patient is instructed to call or message via MyChart if he/she has any questions or concerns regarding our treatment plan. No barriers to understanding were identified. We discussed Red Flag symptoms and signs in detail. Patient expressed understanding regarding what to do in case of urgent or emergency type symptoms.   Medication list was reconciled, printed and provided to the patient in AVS. Patient instructions and summary information was reviewed with the patient as documented in the AVS. This note was prepared with assistance of Dragon voice recognition software. Occasional wrong-word or sound-a-like substitutions may have occurred due to the inherent limitations of voice recognition software  No orders of the defined types were placed in this encounter.  Meds ordered this encounter  Medications  . amoxicillin (AMOXIL) 875 MG tablet    Sig: Take 1 tablet (875 mg total) by mouth 2 (two) times daily for 7 days.    Dispense:  14 tablet    Refill:  0  . guaiFENesin-codeine 100-10 MG/5ML syrup    Sig: Take 5 mLs by mouth every 6 (six) hours as needed for cough.    Dispense:  120 mL    Refill:  0

## 2017-10-13 NOTE — Patient Instructions (Signed)
Please follow up if symptoms do not improve or as needed.   Your lungs are clear now.  Use robitussin DM for daytime cough. Can add sudafed for congestion. Start and complete the abx.   Sinusitis, Adult Sinusitis is soreness and inflammation of your sinuses. Sinuses are hollow spaces in the bones around your face. They are located:  Around your eyes.  In the middle of your forehead.  Behind your nose.  In your cheekbones.  Your sinuses and nasal passages are lined with a stringy fluid (mucus). Mucus normally drains out of your sinuses. When your nasal tissues get inflamed or swollen, the mucus can get trapped or blocked so air cannot flow through your sinuses. This lets bacteria, viruses, and funguses grow, and that leads to infection. Follow these instructions at home: Medicines  Take, use, or apply over-the-counter and prescription medicines only as told by your doctor. These may include nasal sprays.  If you were prescribed an antibiotic medicine, take it as told by your doctor. Do not stop taking the antibiotic even if you start to feel better. Hydrate and Humidify  Drink enough water to keep your pee (urine) clear or pale yellow.  Use a cool mist humidifier to keep the humidity level in your home above 50%.  Breathe in steam for 10-15 minutes, 3-4 times a day or as told by your doctor. You can do this in the bathroom while a hot shower is running.  Try not to spend time in cool or dry air. Rest  Rest as much as possible.  Sleep with your head raised (elevated).  Make sure to get enough sleep each night. General instructions  Put a warm, moist washcloth on your face 3-4 times a day or as told by your doctor. This will help with discomfort.  Wash your hands often with soap and water. If there is no soap and water, use hand sanitizer.  Do not smoke. Avoid being around people who are smoking (secondhand smoke).  Keep all follow-up visits as told by your doctor. This  is important. Contact a doctor if:  You have a fever.  Your symptoms get worse.  Your symptoms do not get better within 10 days. Get help right away if:  You have a very bad headache.  You cannot stop throwing up (vomiting).  You have pain or swelling around your face or eyes.  You have trouble seeing.  You feel confused.  Your neck is stiff.  You have trouble breathing. This information is not intended to replace advice given to you by your health care provider. Make sure you discuss any questions you have with your health care provider. Document Released: 11/08/2007 Document Revised: 01/16/2016 Document Reviewed: 03/17/2015 Elsevier Interactive Patient Education  [employer].

## 2017-12-15 DIAGNOSIS — M1712 Unilateral primary osteoarthritis, left knee: Secondary | ICD-10-CM | POA: Diagnosis not present

## 2018-02-21 ENCOUNTER — Other Ambulatory Visit: Payer: Self-pay | Admitting: Family Medicine

## 2018-04-30 ENCOUNTER — Ambulatory Visit: Payer: Medicare Other

## 2018-04-30 ENCOUNTER — Telehealth: Payer: Self-pay | Admitting: Family Medicine

## 2018-04-30 ENCOUNTER — Other Ambulatory Visit: Payer: Self-pay

## 2018-04-30 ENCOUNTER — Ambulatory Visit (INDEPENDENT_AMBULATORY_CARE_PROVIDER_SITE_OTHER): Payer: Medicare Other

## 2018-04-30 VITALS — BP 130/70 | HR 81 | Temp 98.1°F | Ht 59.5 in | Wt 185.5 lb

## 2018-04-30 DIAGNOSIS — Z0000 Encounter for general adult medical examination without abnormal findings: Secondary | ICD-10-CM | POA: Diagnosis not present

## 2018-04-30 DIAGNOSIS — E781 Pure hyperglyceridemia: Secondary | ICD-10-CM

## 2018-04-30 MED ORDER — HALOBETASOL PROPIONATE 0.05 % EX CREA: TOPICAL_CREAM | CUTANEOUS | 0 refills | Status: AC

## 2018-04-30 NOTE — Telephone Encounter (Signed)
-----   Message from Eustace Pen, LPN sent at [phone]  2:05 PM EST ----- Regarding: Lab 11/26 Lab orders needed. Thank you.  Insurance:  [employer]

## 2018-04-30 NOTE — Patient Instructions (Signed)
Crystal Gonzalez , Thank you for taking time to come for your Medicare Wellness Visit. I appreciate your ongoing commitment to your health goals. Please review the following plan we discussed and let me know if I can assist you in the future.   These are the goals we discussed: Goals    . DIET - INCREASE WATER INTAKE     Starting 111/26/2019, I will continue to drink at least 6-8 glasses of water daily.        This is a list of the screening recommended for you and due dates:  Health Maintenance  Topic Date Due  . Mammogram  06/04/2018*  . Flu Shot  09/04/2018*  . Stool Blood Test  04/12/2019*  . Pap Smear  03/18/2019  . Cologuard (Stool DNA test)  04/12/2019  .  Hepatitis C: One time screening is recommended by Center for Disease Control  (CDC) for  adults born from [birth year] through 1964.   Addressed  *Topic was postponed. The date shown is not the original due date.   Preventive Care for Adults  A healthy lifestyle and preventive care can promote health and wellness. Preventive health guidelines for adults include the following key practices.  . A routine yearly physical is a good way to check with your health care provider about your health and preventive screening. It is a chance to share any concerns and updates on your health and to receive a thorough exam.  . Visit your dentist for a routine exam and preventive care every 6 months. Brush your teeth twice a day and floss once a day. Good oral hygiene prevents tooth decay and gum disease.  . The frequency of eye exams is based on your age, health, family medical history, use  of contact lenses, and other factors. Follow your health care provider's recommendations for frequency of eye exams.  . Eat a healthy diet. Foods like vegetables, fruits, whole grains, low-fat dairy products, and lean protein foods contain the nutrients you need without too many calories. Decrease your intake of foods high in solid fats, added sugars, and salt. Eat  the right amount of calories for you. Get information about a proper diet from your health care provider, if necessary.  . Regular physical exercise is one of the most important things you can do for your health. Most adults should get at least 150 minutes of moderate-intensity exercise (any activity that increases your heart rate and causes you to sweat) each week. In addition, most adults need muscle-strengthening exercises on 2 or more days a week.  Silver Sneakers may be a benefit available to you. To determine eligibility, you may visit the website: www.silversneakers.com or contact program at 636-224-7394 Mon-Fri between 8AM-8PM.   . Maintain a healthy weight. The body mass index (BMI) is a screening tool to identify possible weight problems. It provides an estimate of body fat based on height and weight. Your health care provider can find your BMI and can help you achieve or maintain a healthy weight.   For adults 20 years and older: ? A BMI below 18.5 is considered underweight. ? A BMI of 18.5 to 24.9 is normal. ? A BMI of 25 to 29.9 is considered overweight. ? A BMI of 30 and above is considered obese.   . Maintain normal blood lipids and cholesterol levels by exercising and minimizing your intake of saturated fat. Eat a balanced diet with plenty of fruit and vegetables. Blood tests for lipids and cholesterol should begin at  age 14 and be repeated every 5 years. If your lipid or cholesterol levels are high, you are over 50, or you are at high risk for heart disease, you may need your cholesterol levels checked more frequently. Ongoing high lipid and cholesterol levels should be treated with medicines if diet and exercise are not working.  . If you smoke, find out from your health care provider how to quit. If you do not use tobacco, please do not start.  . If you choose to drink alcohol, please do not consume more than 2 drinks per day. One drink is considered to be 12 ounces (355 mL)  of beer, 5 ounces (148 mL) of wine, or 1.5 ounces (44 mL) of liquor.  . If you are 33-82 years old, ask your health care provider if you should take aspirin to prevent strokes.  . Use sunscreen. Apply sunscreen liberally and repeatedly throughout the day. You should seek shade when your shadow is shorter than you. Protect yourself by wearing long sleeves, pants, a wide-brimmed hat, and sunglasses year round, whenever you are outdoors.  . Once a month, do a whole body skin exam, using a mirror to look at the skin on your back. Tell your health care provider of new moles, moles that have irregular borders, moles that are larger than a pencil eraser, or moles that have changed in shape or color.

## 2018-04-30 NOTE — Progress Notes (Signed)
Subjective:   Crystal Gonzalez is a 62 y.o. female who presents for Medicare Annual (Subsequent) preventive examination.  Review of Systems:  N/A Cardiac Risk Factors include: advanced age (>44men, >76 women);dyslipidemia;obesity (BMI >30kg/m2)     Objective:     Vitals: BP 130/70 (BP Location: Right Arm, Patient Position: Sitting, Cuff Size: Normal)   Pulse 81   Temp 98.1 F (36.7 C) (Oral)   Ht 4' 11.5" (1.511 m) Comment: shoes  Wt 185 lb 8 oz (84.1 kg)   SpO2 94%   BMI 36.84 kg/m   Body mass index is 36.84 kg/m.  Advanced Directives 04/30/2018 04/24/2017 03/17/2016 05/16/2015  Does Patient Have a Medical Advance Directive? No No No No  Would patient like information on creating a medical advance directive? No - Patient declined No - Patient declined Yes - Scientist, clinical (histocompatibility and immunogenetics) given No - patient declined information    Tobacco Social History   Tobacco Use  Smoking Status Former Smoker  . Types: Cigarettes  Smokeless Tobacco Never Used     Counseling given: No   Clinical Intake:  Pre-visit preparation completed: Yes  Pain : 0-10 Pain Score: 10-Worst pain ever Pain Type: Chronic pain Pain Location: Back Pain Orientation: Lower Pain Descriptors / Indicators: Constant Pain Onset: More than a month ago Pain Frequency: Constant     Nutritional Status: BMI > 30  Obese Nutritional Risks: None Diabetes: No  How often do you need to have someone help you when you read instructions, pamphlets, or other written materials from your doctor or pharmacy?: 1 - Never What is the last grade level you completed in school?: GED + some college courses  Interpreter Needed?: No  Comments: pt lives with spouse Information entered by :: LPinson, LPN  Past Medical History:  Diagnosis Date  . Asthma   . Hemorrhoids   . UTI (lower urinary tract infection)    Past Surgical History:  Procedure Laterality Date  . BREAST BIOPSY    . KNEE SURGERY     x 5 surgeries  .  MYOMECTOMY     Family History  Problem Relation Age of Onset  . Stroke Mother   . Hypertension Mother   . Heart disease Mother 76       massive MI  . Hypertension Father   . Heart disease Father   . Prostate cancer Father   . Breast cancer Maternal Aunt   . Cancer Maternal Aunt 40       breast cancer   Social History   Socioeconomic History  . Marital status: Widowed    Spouse name: Not on file  . Number of children: Not on file  . Years of education: Not on file  . Highest education level: Not on file  Occupational History  . Not on file  Social Needs  . Financial resource strain: Not on file  . Food insecurity:    Worry: Not on file    Inability: Not on file  . Transportation needs:    Medical: Not on file    Non-medical: Not on file  Tobacco Use  . Smoking status: Former Smoker    Types: Cigarettes  . Smokeless tobacco: Never Used  Substance and Sexual Activity  . Alcohol use: Yes    Alcohol/week: 0.0 standard drinks    Comment: occ  . Drug use: No  . Sexual activity: Not on file  Lifestyle  . Physical activity:    Days per week: Not on file  Minutes per session: Not on file  . Stress: Not on file  Relationships  . Social connections:    Talks on phone: Not on file    Gets together: Not on file    Attends religious service: Not on file    Active member of club or organization: Not on file    Attends meetings of clubs or organizations: Not on file    Relationship status: Not on file  Other Topics Concern  . Not on file  Social History Narrative  . Not on file    Outpatient Encounter Medications as of 04/30/2018  Medication Sig  . acetaminophen (TYLENOL) 500 MG tablet Take 750 mg by mouth as needed.  Marland Kitchen ADVAIR DISKUS 250-50 MCG/DOSE AEPB INHALE 1 PUFF INTO THE LUNGS TWICE DAILY  . albuterol (PROVENTIL HFA;VENTOLIN HFA) 108 (90 BASE) MCG/ACT inhaler Inhale 2 puffs into the lungs every 6 (six) hours as needed for wheezing or shortness of breath.  Marland Kitchen  albuterol (PROVENTIL) (2.5 MG/3ML) 0.083% nebulizer solution Take 3 mLs (2.5 mg total) by nebulization every 6 (six) hours as needed for wheezing or shortness of breath.  . EPIPEN 2-PAK 0.3 MG/0.3ML SOAJ injection INJECT 0.3 MLS (0.3 MG TOTAL) INTO THE MUSCLE ONCE.  . guaiFENesin-codeine 100-10 MG/5ML syrup Take 5 mLs by mouth every 6 (six) hours as needed for cough.  . halobetasol (ULTRAVATE) 0.05 % cream APPLY TOPICALLY TO AFFECTED ITCHY AREAS 2 TIMES DAILY AS NEEDED  . montelukast (SINGULAIR) 10 MG tablet TAKE 1 TABLET BY MOUTH AT BEDTIME  . pantoprazole (PROTONIX) 20 MG tablet TAKE 1 TABLET BY MOUTH EVERY DAY  . triamcinolone (NASACORT ALLERGY 24HR) 55 MCG/ACT AERO nasal inhaler Place 2 sprays into the nose daily.   No facility-administered encounter medications on file as of 04/30/2018.     Activities of Daily Living In your present state of health, do you have any difficulty performing the following activities: 04/30/2018  Hearing? N  Vision? N  Difficulty concentrating or making decisions? N  Walking or climbing stairs? N  Dressing or bathing? N  Doing errands, shopping? N  Preparing Food and eating ? N  Using the Toilet? N  In the past six months, have you accidently leaked urine? N  Do you have problems with loss of bowel control? N  Managing your Medications? N  Managing your Finances? N  Housekeeping or managing your Housekeeping? N  Some recent data might be hidden    Patient Care Team: Jinny Sanders, MD as PCP - General (Family Medicine)    Assessment:   This is a routine wellness examination for Crystal Gonzalez.  Exercise Activities and Dietary recommendations Current Exercise Habits: The patient does not participate in regular exercise at present, Exercise limited by: orthopedic condition(s)  Goals    . DIET - INCREASE WATER INTAKE     Starting 111/26/2019, I will continue to drink at least 6-8 glasses of water daily.        Fall Risk Fall Risk  04/30/2018  04/24/2017 03/17/2016  Falls in the past year? 1 No Yes  Number falls in past yr: 1 - 2 or more  Injury with Fall? 1 - Yes  Depression Screen PHQ 2/9 Scores 04/30/2018 04/24/2017 03/17/2016  PHQ - 2 Score 0 0 0  PHQ- 9 Score 0 0 -     Cognitive Function MMSE - Mini Mental State Exam 04/30/2018 04/24/2017  Orientation to time 5 5  Orientation to Place 5 5  Registration 3 3  Attention/  Calculation 0 0  Recall 3 3  Language- name 2 objects 0 0  Language- repeat 1 1  Language- follow 3 step command 3 3  Language- read & follow direction 0 0  Write a sentence 0 0  Copy design 0 0  Total score 20 20     PLEASE NOTE: A Mini-Cog screen was completed. Maximum score is 20. A value of 0 denotes this part of Folstein MMSE was not completed or the patient failed this part of the Mini-Cog screening.   Mini-Cog Screening Orientation to Time - Max 5 pts Orientation to Place - Max 5 pts Registration - Max 3 pts Recall - Max 3 pts Language Repeat - Max 1 pts Language Follow 3 Step Command - Max 3 pts     Immunization History  Administered Date(s) Administered  . Td 06/05/2001   Screening Tests Health Maintenance  Topic Date Due  . MAMMOGRAM  06/04/2018 (Originally 11/15/2006)  . INFLUENZA VACCINE  09/04/2018 (Originally 01/03/2018)  . COLON CANCER SCREENING ANNUAL FOBT  04/12/2019 (Originally 02/26/2016)  . PAP SMEAR  03/18/2019  . Fecal DNA (Cologuard)  04/12/2019  . Hepatitis C Screening  Addressed     Plan:     I have personally reviewed, addressed, and noted the following in the patient's chart:  A. Medical and social history B. Use of alcohol, tobacco or illicit drugs  C. Current medications and supplements D. Functional ability and status E.  Nutritional status F.  Physical activity G. Advance directives H. List of other physicians I.  Hospitalizations, surgeries, and ER visits in previous 12 months J.  [place] to include hearing, vision, cognitive,  depression L. Referrals and appointments - none  In addition, I have reviewed and discussed with patient certain preventive protocols, quality metrics, and best practice recommendations. A written personalized care plan for preventive services as well as general preventive health recommendations were provided to patient.  See attached scanned questionnaire for additional information.   Signed,   Lindell Noe, MHA, BS, LPN Health Coach

## 2018-04-30 NOTE — Telephone Encounter (Signed)
Patient in office today for AWV. Patent requested refill for Halobetasol 0.05% cream. [id number] [place] is pharmacy of choice.

## 2018-04-30 NOTE — Progress Notes (Signed)
PCP notes:   Health maintenance:  Flu vaccine - pt declined  Abnormal screenings:   Fall risk - hx of multi falls Fall Risk  04/30/2018 04/24/2017 03/17/2016  Falls in the past year? 3 No Yes  Number falls in past yr: 1 - 2 or more  Injury with Fall? 1 - Yes    Patient concerns:   None  Nurse concerns:  None  Next PCP appt:   05/12/18 @ 0915

## 2018-05-01 NOTE — Progress Notes (Signed)
I reviewed health advisor's note, was available for consultation, and agree with documentation and plan.  

## 2018-05-03 ENCOUNTER — Other Ambulatory Visit: Payer: Self-pay | Admitting: Family Medicine

## 2018-05-07 ENCOUNTER — Other Ambulatory Visit (INDEPENDENT_AMBULATORY_CARE_PROVIDER_SITE_OTHER): Payer: Medicare Other

## 2018-05-07 ENCOUNTER — Ambulatory Visit (INDEPENDENT_AMBULATORY_CARE_PROVIDER_SITE_OTHER): Payer: Medicare Other | Admitting: Family Medicine

## 2018-05-07 ENCOUNTER — Encounter: Payer: Self-pay | Admitting: Family Medicine

## 2018-05-07 VITALS — BP 140/76 | HR 71 | Temp 98.5°F | Ht 59.5 in | Wt 186.5 lb

## 2018-05-07 DIAGNOSIS — Z6837 Body mass index (BMI) 37.0-37.9, adult: Secondary | ICD-10-CM | POA: Diagnosis not present

## 2018-05-07 DIAGNOSIS — J4541 Moderate persistent asthma with (acute) exacerbation: Secondary | ICD-10-CM

## 2018-05-07 DIAGNOSIS — E781 Pure hyperglyceridemia: Secondary | ICD-10-CM

## 2018-05-07 DIAGNOSIS — Z8673 Personal history of transient ischemic attack (TIA), and cerebral infarction without residual deficits: Secondary | ICD-10-CM

## 2018-05-07 DIAGNOSIS — Z0000 Encounter for general adult medical examination without abnormal findings: Secondary | ICD-10-CM | POA: Diagnosis not present

## 2018-05-07 LAB — COMPREHENSIVE METABOLIC PANEL
ALT: 31 U/L (ref 0–35)
AST: 29 U/L (ref 0–37)
Albumin: 4.3 g/dL (ref 3.5–5.2)
Alkaline Phosphatase: 75 U/L (ref 39–117)
BILIRUBIN TOTAL: 0.4 mg/dL (ref 0.2–1.2)
BUN: 18 mg/dL (ref 6–23)
CO2: 30 meq/L (ref 19–32)
Calcium: 10.1 mg/dL (ref 8.4–10.5)
Chloride: 102 mEq/L (ref 96–112)
Creatinine, Ser: 0.79 mg/dL (ref 0.40–1.20)
GFR: 78.51 mL/min (ref >60.00)
GLUCOSE: 101 mg/dL — AB (ref 70–99)
Potassium: 3.8 mEq/L (ref 3.5–5.1)
SODIUM: 139 meq/L (ref 135–145)
Total Protein: 7.6 g/dL (ref 6.0–8.3)

## 2018-05-07 LAB — LIPID PANEL
Cholesterol: 212 mg/dL — ABNORMAL HIGH (ref 0–200)
HDL: 70.6 mg/dL (ref >39.00)
LDL Cholesterol: 128 mg/dL — ABNORMAL HIGH (ref 0–99)
NonHDL: 141.25
Total CHOL/HDL Ratio: 3
Triglycerides: 66 mg/dL (ref 0.0–149.0)
VLDL: 13.2 mg/dL (ref 0.0–40.0)

## 2018-05-07 NOTE — Progress Notes (Signed)
Subjective:    Patient ID: Crystal Gonzalez, female    DOB: [birth year], 62 y.o.   MRN: [id number]  HPI   The patient presents for complete physical and review of chronic health problems. He/She also has the following acute concerns today: None  The patient saw Candis Musa, LPN for medicare wellness. Note reviewed in detail and important notes copied below.  Health maintenance: Flu vaccine - pt declined  Abnormal screenings:  Fall risk - hx of multi falls Fall Risk  04/30/2018 04/24/2017 03/17/2016  Falls in the past year? 3 No Yes  Number falls in past yr: 1 - 2 or more  Injury with Fall? 1 - Yes   Due to left knee issue s/p 5 surgeries.. No new issues.  05/07/18 Today  Asthma, moderate persistent:stable control. No hospitalizations.  Elevated Cholesterol:  HX of TIA.. Goal LDL < 70 has refused meds in past despite increase risk. Due for re-eval. Muscle aches:  Diet compliance: Moderate Exercise: minimal at this point. Other complaints:    Social History /Family History/Past Medical History reviewed in detail and updated in EMR if needed. Blood pressure 140/76, pulse 71, temperature 98.5 F (36.9 C), temperature source Oral, height 4' 11.5" (1.511 m), weight 186 lb 8 oz (84.6 kg).    BP Readings from Last 3 Encounters:  05/07/18 140/76  04/30/18 130/70  10/13/17 130/80    Review of Systems  Constitutional: Negative for fatigue and fever.  HENT: Negative for congestion.   Eyes: Negative for pain.  Respiratory: Negative for cough and shortness of breath.   Cardiovascular: Negative for chest pain, palpitations and leg swelling.  Gastrointestinal: Negative for abdominal pain.  Genitourinary: Negative for dysuria and vaginal bleeding.  Musculoskeletal: Negative for back pain.  Neurological: Negative for syncope, light-headedness and headaches.  Psychiatric/Behavioral: Negative for dysphoric mood.       Objective:   Physical Exam  Constitutional:  Vital signs are normal. She appears well-developed and well-nourished. She is cooperative.  Non-toxic appearance. She does not appear ill. No distress.  obesity  HENT:  Head: Normocephalic.  Right Ear: Hearing, tympanic membrane, external ear and ear canal normal.  Left Ear: Hearing, tympanic membrane, external ear and ear canal normal.  Nose: Nose normal.  Eyes: Pupils are equal, round, and reactive to light. Conjunctivae, EOM and lids are normal. Lids are everted and swept, no foreign bodies found.  Neck: Trachea normal and normal range of motion. Neck supple. Carotid bruit is not present. No thyroid mass and no thyromegaly present.  Cardiovascular: Normal rate, regular rhythm, S1 normal, S2 normal, normal heart sounds and intact distal pulses. Exam reveals no gallop.  No murmur heard. Pulmonary/Chest: Effort normal and breath sounds normal. No respiratory distress. She has no wheezes. She has no rhonchi. She has no rales.  Abdominal: Soft. Normal appearance and bowel sounds are normal. She exhibits no distension, no fluid wave, no abdominal bruit and no mass. There is no hepatosplenomegaly. There is no tenderness. There is no rebound, no guarding and no CVA tenderness. No hernia.  Musculoskeletal:       Left knee: She exhibits decreased range of motion.  Lymphadenopathy:    She has no cervical adenopathy.    She has no axillary adenopathy.  Neurological: She is alert. She has normal strength. No cranial nerve deficit or sensory deficit.  Skin: Skin is warm, dry and intact. No rash noted.  Psychiatric: Her speech is normal and behavior is normal. Judgment normal. Her mood appears not  anxious. Cognition and memory are normal. She does not exhibit a depressed mood.          Assessment & Plan:  The patient's preventative maintenance and recommended screening tests for an annual wellness exam were reviewed in full today. Brought up to date unless services declined.  Counselled on the  importance of diet, exercise, and its role in overall health and mortality. The patient's FH and SH was reviewed, including their home life, tobacco status, and drug and alcohol status.  Body mass index is 37.04 kg/m.  Last mammogram9 years ago. Due now.  She will set up where her sister goes, no mass. Last pelvic exam:PAP nml, neg HPV 2017 repeat in 5 years.. Colonoscopy: 2003 nml,  cologuard 2017.. repeat due in 2020 Vaccines: Due Tdap but she is allergic. Refused flu. Nonsmoker Hep C:neg HIV: refused.    Clinical Support from 04/30/2018 in [place] at [facility] Total Score  0

## 2018-05-07 NOTE — Assessment & Plan Note (Signed)
Encouraged exercise, weight loss, healthy eating habits. ? ?

## 2018-05-07 NOTE — Assessment & Plan Note (Signed)
She will consider starting baby aspirin for secondary prevention.

## 2018-05-07 NOTE — Assessment & Plan Note (Signed)
Stable control on Advair, Singulair, albuterol prn.

## 2018-05-07 NOTE — Patient Instructions (Addendum)
Recommend baby aspirin given mini stroke history.   We will call  With lab results.  Consider cane for stability.  Call to set up mammogram on your own

## 2018-05-07 NOTE — Assessment & Plan Note (Signed)
Due for re-eval. 

## 2018-05-08 ENCOUNTER — Telehealth: Payer: Self-pay | Admitting: *Deleted

## 2018-05-08 NOTE — Telephone Encounter (Signed)
Crystal Gonzalez is requesting that a new handicap placard application be filled out for 5 years and not 6 months.  She states she is permanantly disabled and does not want to have to go to [facility] every 6 months.  New handicap application completed and placed in Dr. Diona Browner' s in box for signature.  Patient is aware that Dr. Diona Browner is not back in the office unil 05/14/2018.

## 2018-05-13 DIAGNOSIS — Z803 Family history of malignant neoplasm of breast: Secondary | ICD-10-CM | POA: Diagnosis not present

## 2018-05-13 DIAGNOSIS — Z1231 Encounter for screening mammogram for malignant neoplasm of breast: Secondary | ICD-10-CM | POA: Diagnosis not present

## 2018-05-17 ENCOUNTER — Other Ambulatory Visit: Payer: Self-pay | Admitting: Family Medicine

## 2018-05-21 ENCOUNTER — Other Ambulatory Visit: Payer: Self-pay | Admitting: Family Medicine

## 2018-05-21 ENCOUNTER — Encounter: Payer: Self-pay | Admitting: Family Medicine

## 2018-05-21 DIAGNOSIS — N6489 Other specified disorders of breast: Secondary | ICD-10-CM | POA: Diagnosis not present

## 2018-05-21 DIAGNOSIS — N6315 Unspecified lump in the right breast, overlapping quadrants: Secondary | ICD-10-CM | POA: Diagnosis not present

## 2018-05-21 DIAGNOSIS — R921 Mammographic calcification found on diagnostic imaging of breast: Secondary | ICD-10-CM | POA: Diagnosis not present

## 2018-06-03 ENCOUNTER — Encounter: Payer: Self-pay | Admitting: Family Medicine

## 2018-06-03 ENCOUNTER — Other Ambulatory Visit: Payer: Self-pay | Admitting: Radiology

## 2018-06-03 DIAGNOSIS — N6315 Unspecified lump in the right breast, overlapping quadrants: Secondary | ICD-10-CM | POA: Diagnosis not present

## 2018-06-03 LAB — HM MAMMOGRAPHY

## 2018-06-13 ENCOUNTER — Encounter: Payer: Self-pay | Admitting: Family Medicine

## 2018-06-13 ENCOUNTER — Telehealth: Payer: Self-pay | Admitting: Family Medicine

## 2018-06-13 ENCOUNTER — Ambulatory Visit (INDEPENDENT_AMBULATORY_CARE_PROVIDER_SITE_OTHER): Payer: Medicare Other | Admitting: Family Medicine

## 2018-06-13 VITALS — BP 104/70 | HR 80 | Temp 98.8°F | Ht 59.5 in | Wt 182.5 lb

## 2018-06-13 DIAGNOSIS — R05 Cough: Secondary | ICD-10-CM

## 2018-06-13 DIAGNOSIS — J101 Influenza due to other identified influenza virus with other respiratory manifestations: Secondary | ICD-10-CM | POA: Diagnosis not present

## 2018-06-13 DIAGNOSIS — R059 Cough, unspecified: Secondary | ICD-10-CM

## 2018-06-13 LAB — POC INFLUENZA A&B (BINAX/QUICKVUE)
INFLUENZA A, POC: POSITIVE — AB
Influenza B, POC: NEGATIVE

## 2018-06-13 MED ORDER — EPIPEN 2-PAK 0.3 MG/0.3ML IJ SOAJ: INTRAMUSCULAR | 0 refills | Status: AC

## 2018-06-13 MED ORDER — OSELTAMIVIR PHOSPHATE 75 MG PO CAPS: 75.0000 mg | ORAL_CAPSULE | Freq: Two times a day (BID) | ORAL | 0 refills | Status: DC

## 2018-06-13 NOTE — Telephone Encounter (Signed)
Talked to patient who was seen by [facility] today. Pt was prescribed Tamiflu, and pt is declining to take the medication. Was told by pt that the side effects for the medication are worse than the actual flu symptoms she has. Pt is requesting a antibiotic. She stated she told Dr.Bedsole she is allergic to everything. Please advise pt.

## 2018-06-13 NOTE — Assessment & Plan Note (Signed)
Discussed symptomatic care.  Hydration, rest. Call if SOB, cough worsening or prolongued fever. Reviewed flu timeline. She is high risk with asthma  that puts her at risk for complications, so we decided to use of tamiflu. Pt agreed. Discussed  Close contact prophylaxis. She was advised to not return to work until 24 hour after fever resolved on no antipyretics.

## 2018-06-13 NOTE — Telephone Encounter (Signed)
Antibiotics are not indicated in influenza A which she has a documented result of.  Antibiotics do not help with a viral infection.  She had no sign of bacterial infeciton.  She does not have to take Tamiflu although I believe the SE are not worse than the flu and  Given she is higher risk with asthma... benefit outweighs risk of possible side effects.

## 2018-06-13 NOTE — Patient Instructions (Addendum)
Rest fluids.  Complete Tamiflu course.  Cough suppressant at night.  Continue advair and  Use albuterol and needed for wheeze, shortness of breath.  Expect 7-10 day course of illness.  Call if shortness of breath worsening.   Influenza, Adult Influenza, more commonly known as "the flu," is a viral infection that mainly affects the respiratory tract. The respiratory tract includes organs that help you breathe, such as the lungs, nose, and throat. The flu causes many symptoms similar to the common cold along with high fever and body aches. The flu spreads easily from person to person (is contagious). Getting a flu shot (influenza vaccination) every year is the best way to prevent the flu. What are the causes? This condition is caused by the influenza virus. You can get the virus by:  Breathing in droplets that are in the air from an infected person's cough or sneeze.  Touching something that has been exposed to the virus (has been contaminated) and then touching your mouth, nose, or eyes. What increases the risk? The following factors may make you more likely to get the flu:  Not washing or sanitizing your hands often.  Having close contact with many people during cold and flu season.  Touching your mouth, eyes, or nose without first washing or sanitizing your hands.  Not getting a yearly (annual) flu shot. You may have a higher risk for the flu, including serious problems such as a lung infection (pneumonia), if you:  Are older than 65.  Are pregnant.  Have a weakened disease-fighting system (immune system). You may have a weakened immune system if you: ? Have HIV or AIDS. ? Are undergoing chemotherapy. ? Are taking medicines that reduce (suppress) the activity of your immune system.  Have a long-term (chronic) illness, such as heart disease, kidney disease, diabetes, or lung disease.  Have a liver disorder.  Are severely overweight (morbidly obese).  Have anemia. This is  a condition that affects your red blood cells.  Have asthma. What are the signs or symptoms? Symptoms of this condition usually begin suddenly and last 4-14 days. They may include:  Fever and chills.  Headaches, body aches, or muscle aches.  Sore throat.  Cough.  Runny or stuffy (congested) nose.  Chest discomfort.  Poor appetite.  Weakness or fatigue.  Dizziness.  Nausea or vomiting. How is this diagnosed? This condition may be diagnosed based on:  Your symptoms and medical history.  A physical exam.  Swabbing your nose or throat and testing the fluid for the influenza virus. How is this treated? If the flu is diagnosed early, you can be treated with medicine that can help reduce how severe the illness is and how long it lasts (antiviral medicine). This may be given by mouth (orally) or through an IV. Taking care of yourself at home can help relieve symptoms. Your health care provider may recommend:  Taking over-the-counter medicines.  Drinking plenty of fluids. In many cases, the flu goes away on its own. If you have severe symptoms or complications, you may be treated in a hospital. Follow these instructions at home: Activity  Rest as needed and get plenty of sleep.  Stay home from work or school as told by your health care provider. Unless you are visiting your health care provider, avoid leaving home until your fever has been gone for 24 hours without taking medicine. Eating and drinking  Take an oral rehydration solution (ORS). This is a drink that is sold at pharmacies and  retail stores.  Drink enough fluid to keep your urine pale yellow.  Drink clear fluids in small amounts as you are able. Clear fluids include water, ice chips, diluted fruit juice, and low-calorie sports drinks.  Eat bland, easy-to-digest foods in small amounts as you are able. These foods include bananas, applesauce, rice, lean meats, toast, and crackers.  Avoid drinking fluids that  contain a lot of sugar or caffeine, such as energy drinks, regular sports drinks, and soda.  Avoid alcohol.  Avoid spicy or fatty foods. General instructions      Take over-the-counter and prescription medicines only as told by your health care provider.  Use a cool mist humidifier to add humidity to the air in your home. This can make it easier to breathe.  Cover your mouth and nose when you cough or sneeze.  Wash your hands with soap and water often, especially after you cough or sneeze. If soap and water are not available, use alcohol-based hand sanitizer.  Keep all follow-up visits as told by your health care provider. This is important. How is this prevented?   Get an annual flu shot. You may get the flu shot in late summer, fall, or winter. Ask your health care provider when you should get your flu shot.  Avoid contact with people who are sick during cold and flu season. This is generally fall and winter. Contact a health care provider if:  You develop new symptoms.  You have: ? Chest pain. ? Diarrhea. ? A fever.  Your cough gets worse.  You produce more mucus.  You feel nauseous or you vomit. Get help right away if:  You develop shortness of breath or difficulty breathing.  Your skin or nails turn a bluish color.  You have severe pain or stiffness in your neck.  You develop a sudden headache or sudden pain in your face or ear.  You cannot eat or drink without vomiting. Summary  Influenza, more commonly known as "the flu," is a viral infection that primarily affects your respiratory tract.  Symptoms of the flu usually begin suddenly and last 4-14 days.  Getting an annual flu shot is the best way to prevent getting the flu.  Stay home from work or school as told by your health care provider. Unless you are visiting your health care provider, avoid leaving home until your fever has been gone for 24 hours without taking medicine.  Keep all follow-up  visits as told by your health care provider. This is important. This information is not intended to replace advice given to you by your health care provider. Make sure you discuss any questions you have with your health care provider. Document Released: 05/19/2000 Document Revised: 11/07/2017 Document Reviewed: 11/07/2017 Elsevier Interactive Patient Education  2019 [employer].

## 2018-06-13 NOTE — Progress Notes (Signed)
Subjective:    Patient ID: Crystal Gonzalez, female    DOB: Oct 13, 1955, 63 y.o.   MRN: [id number]  Cough  This is a new problem. The current episode started yesterday. The problem has been rapidly worsening. The cough is productive of sputum. Associated symptoms include chills, ear congestion, headaches, nasal congestion, a sore throat, shortness of breath and wheezing. Pertinent negatives include no fever. Associated symptoms comments:  Fatigue  sinus pressure  chills  body aches. Treatments tried:  using adviatr.. has not started albuterol yet. Her past medical history is significant for asthma. There is no history of COPD or environmental allergies. moderate persistent asthma  Shortness of Breath  This is a new problem. The current episode started yesterday. The problem has been rapidly worsening. Associated symptoms include headaches, a sore throat and wheezing. Pertinent negatives include no fever. Exacerbated by: nonsmoker. Her past medical history is significant for allergies and asthma. There is no history of COPD, a heart failure or PE. (Moderate persistent asthma)   Sick contacts at home.. roomates  Did not get flu shot.  Social History /Family History/Past Medical History reviewed in detail and updated in EMR if needed. Blood pressure 104/70, pulse 80, temperature 98.8 F (37.1 C), temperature source Oral, height 4' 11.5" (1.511 m), weight 182 lb 8 oz (82.8 kg), SpO2 94 %.  Review of Systems  Constitutional: Positive for chills. Negative for fever.  HENT: Positive for sore throat.   Respiratory: Positive for cough, shortness of breath and wheezing.   Allergic/Immunologic: Negative for environmental allergies.  Neurological: Positive for headaches.       Objective:   Physical Exam Constitutional:      General: She is not in acute distress.    Appearance: She is well-developed. She is not ill-appearing or toxic-appearing.  HENT:     Head: Normocephalic.     Right Ear:  Hearing, tympanic membrane, ear canal and external ear normal. Tympanic membrane is not erythematous, retracted or bulging.     Left Ear: Hearing, tympanic membrane, ear canal and external ear normal. Tympanic membrane is not erythematous, retracted or bulging.     Nose: Mucosal edema and rhinorrhea present.     Right Sinus: No maxillary sinus tenderness or frontal sinus tenderness.     Left Sinus: No maxillary sinus tenderness or frontal sinus tenderness.     Mouth/Throat:     Pharynx: Uvula midline.  Eyes:     General: Lids are normal. Lids are everted, no foreign bodies appreciated.     Conjunctiva/sclera: Conjunctivae normal.     Pupils: Pupils are equal, round, and reactive to light.  Neck:     Musculoskeletal: Normal range of motion and neck supple.     Thyroid: No thyroid mass or thyromegaly.     Vascular: No carotid bruit.     Trachea: Trachea normal.  Cardiovascular:     Rate and Rhythm: Normal rate and regular rhythm.     Pulses: Normal pulses.     Heart sounds: Normal heart sounds, S1 normal and S2 normal. No murmur. No friction rub. No gallop.   Pulmonary:     Effort: Pulmonary effort is normal. No tachypnea or respiratory distress.     Breath sounds: Normal breath sounds. No decreased breath sounds, wheezing, rhonchi or rales.  Skin:    General: Skin is warm and dry.     Findings: No rash.  Neurological:     Mental Status: She is alert.  Psychiatric:  Mood and Affect: Mood is not anxious or depressed.        Speech: Speech normal.        Behavior: Behavior normal. Behavior is cooperative.        Judgment: Judgment normal.           Assessment & Plan:

## 2018-06-14 ENCOUNTER — Telehealth: Payer: Self-pay

## 2018-06-14 NOTE — Telephone Encounter (Addendum)
Crystal Gonzalez notified as instructed by telephone.  She states she is not going to take the Tamiflu because she is allergic to everything.   I recommended symptomatic care at this point.  Rest, push fluid and tylenol for fever and body aches.

## 2018-06-14 NOTE — Telephone Encounter (Signed)
Pt calling upset that pt is not going to take the tamiflu with all the possible side effects. Pt knows her body and wants an abx sent to [place]. Pt said she wants abx before she gets sicker, because sickness in lungs already and pt does not want to go into pneumonia. Pt request cb. Pt is supposed to go out of town in 2 wks so needs abx now.

## 2018-06-14 NOTE — Telephone Encounter (Signed)
See other note. Pt already notified.  I will not prescribe and antibiotics for influenza.  She can find another MD if she does not agree with my care.

## 2018-06-19 DIAGNOSIS — J069 Acute upper respiratory infection, unspecified: Secondary | ICD-10-CM | POA: Diagnosis not present

## 2018-06-19 NOTE — Telephone Encounter (Signed)
Noted. If pts breathing is worse she should be seen again for evaluation to determine if new bacterial infection. Agree with Urgent Care disposition. Prednisone on allergy list.  Note: pt unhappy with my care from the beginning, when antibiotics not given immediately upon dx of flu. It sounds appropriate for her to find a new doctor if she does not agree with my antibiotic stewardship practices.  Will send FYI to practice manager to determine if anything else needs to be done at this time.

## 2018-06-19 NOTE — Telephone Encounter (Signed)
Pt called stating that she cannot get her breath; pt is talking in full sentences but said she knows her body and she needs an abx and is Dr Diona Browner going to call abx in for her. I asked pt if she was having any wheezing and pt raised her voice and said she was having wheezing and trouble getting her breath and if Dr Diona Browner would not call in an abx she was going to [facility] and getting a different dr. I asked if pt had someone to drive her to [facility] and she said she could drive herself. Pt asked again if Dr Diona Browner was going to give her an abx. Advised per note Dr Diona Browner comments and pt said she is going to [facility] now and getting a different doctor and hung up the phone.

## 2018-09-26 ENCOUNTER — Ambulatory Visit (INDEPENDENT_AMBULATORY_CARE_PROVIDER_SITE_OTHER): Payer: Medicare Other | Admitting: Family Medicine

## 2018-09-26 ENCOUNTER — Encounter: Payer: Self-pay | Admitting: Family Medicine

## 2018-09-26 DIAGNOSIS — K644 Residual hemorrhoidal skin tags: Secondary | ICD-10-CM

## 2018-09-26 MED ORDER — HYDROCORTISONE (PERIANAL) 2.5 % EX CREA: 1.0000 "application " | TOPICAL_CREAM | Freq: Two times a day (BID) | CUTANEOUS | 0 refills | Status: AC

## 2018-09-26 NOTE — Progress Notes (Signed)
VIRTUAL VISIT Due to national recommendations of social distancing due to [place] 19, a virtual visit is felt to be most appropriate for this patient at this time.   I connected with the patient on 09/26/18 at 10:20 AM EDT by virtual telehealth platform and verified that I am speaking with the correct person using two identifiers.   I discussed the limitations, risks, security and privacy concerns of performing an evaluation and management service by  virtual telehealth platform and the availability of in person appointments. I also discussed with the patient that there may be a patient responsible charge related to this service. The patient expressed understanding and agreed to proceed.  Patient location: Home Provider Location: [place] Participants: Eliezer Lofts and Evelina Dun   Chief Complaint  Patient presents with  . Hemorrhoids    wants rx for proctosol HC 2.5%    History of Present Illness: 63 year old female with history of hemorrhoids presents with  new onset rectal irritation. Area is itchy, feels small lump.  No blood in stool.   She has had intermittent issues with this every few months... was using proctosol HC 2.5%.  When apply this the area resolves in a couple days.   She is not constipated but does straining off and on. Food related.  She is on PPI for GERD.  Last colonoscopy:  none  COVID 19 screen No recent travel or known exposure to [place] The patient denies respiratory symptoms of COVID 19 at this time.  The importance of social distancing was discussed today.   Review of Systems  Constitutional: Negative for chills and fever.  HENT: Negative for congestion and ear pain.   Eyes: Negative for pain and redness.  Respiratory: Negative for cough and shortness of breath.   Cardiovascular: Negative for chest pain, palpitations and leg swelling.  Gastrointestinal: Negative for abdominal pain, blood in stool, constipation, diarrhea, nausea and  vomiting.  Genitourinary: Negative for dysuria.  Musculoskeletal: Negative for falls and myalgias.  Skin: Negative for rash.  Neurological: Negative for dizziness.  Psychiatric/Behavioral: Negative for depression. The patient is not nervous/anxious.       Past Medical History:  Diagnosis Date  . Asthma   . Hemorrhoids   . UTI (lower urinary tract infection)     reports that she has quit smoking. Her smoking use included cigarettes. She has never used smokeless tobacco. She reports current alcohol use. She reports that she does not use drugs.   Current Outpatient Medications:  .  acetaminophen (TYLENOL) 500 MG tablet, Take 750 mg by mouth as needed., Disp: , Rfl:  .  ADVAIR DISKUS 250-50 MCG/DOSE AEPB, INHALE 1 PUFF INTO THE LUNGS TWICE DAILY, Disp: 180 each, Rfl: 3 .  albuterol (PROVENTIL HFA;VENTOLIN HFA) 108 (90 BASE) MCG/ACT inhaler, Inhale 2 puffs into the lungs every 6 (six) hours as needed for wheezing or shortness of breath., Disp: 1 Inhaler, Rfl: 0 .  albuterol (PROVENTIL) (2.5 MG/3ML) 0.083% nebulizer solution, Take 3 mLs (2.5 mg total) by nebulization every 6 (six) hours as needed for wheezing or shortness of breath., Disp: 150 mL, Rfl: 1 .  EPIPEN 2-PAK 0.3 MG/0.3ML SOAJ injection, INJECT 0.3 MLS (0.3 MG TOTAL) INTO THE MUSCLE ONCE., Disp: 1 Device, Rfl: 0 .  halobetasol (ULTRAVATE) 0.05 % cream, APPLY TOPICALLY TO AFFECTED ITCHY AREAS 2 TIMES DAILY AS NEEDED, Disp: 50 g, Rfl: 0 .  montelukast (SINGULAIR) 10 MG tablet, TAKE 1 TABLET BY MOUTH AT BEDTIME, Disp: 90 tablet, Rfl:  3 .  Multiple Vitamins-Minerals (PRESERVISION AREDS PO), Take 1 tablet by mouth daily., Disp: , Rfl:  .  pantoprazole (PROTONIX) 20 MG tablet, TAKE 1 TABLET BY MOUTH EVERY DAY, Disp: 90 tablet, Rfl: 3 .  triamcinolone (NASACORT ALLERGY 24HR) 55 MCG/ACT AERO nasal inhaler, Place 2 sprays into the nose daily., Disp: 3 Inhaler, Rfl: 1   Observations/Objective: Height 4' 11.5" (1.511 m).  Physical Exam   Physical Exam Constitutional:      General: She is not in acute distress. Pulmonary:     Effort: Pulmonary effort is normal. No respiratory distress.  Neurological:     Mental Status: She is alert and oriented to person, place, and time.  Psychiatric:        Mood and Affect: Mood normal.        Behavior: Behavior normal.   Assessment and Plan External hemorrhoid Symptoms are typical and respond to proctosol. Will refill as requested.  Discussed constipation treatment with water, fiber and miralax as needed.     I discussed the assessment and treatment plan with the patient. The patient was provided an opportunity to ask questions and all were answered. The patient agreed with the plan and demonstrated an understanding of the instructions.   The patient was advised to call back or seek an in-person evaluation if the symptoms worsen or if the condition fails to improve as anticipated.     Eliezer Lofts, MD

## 2018-09-26 NOTE — Patient Instructions (Signed)
It was nice speaking with yo today.  I have sent in the procotsol for you to use as needed.  Make sure to avoid foods that constipate you and to keep up with water and high fiber diet.  If you become constipated you can use miralax as needed.

## 2018-09-26 NOTE — Assessment & Plan Note (Signed)
Symptoms are typical and respond to proctosol. Will refill as requested.  Discussed constipation treatment with water, fiber and miralax as needed.

## 2018-11-13 DIAGNOSIS — Z03818 Encounter for observation for suspected exposure to other biological agents ruled out: Secondary | ICD-10-CM | POA: Diagnosis not present

## 2018-11-20 DIAGNOSIS — U071 COVID-19: Secondary | ICD-10-CM | POA: Diagnosis not present

## 2018-11-28 DIAGNOSIS — R921 Mammographic calcification found on diagnostic imaging of breast: Secondary | ICD-10-CM | POA: Diagnosis not present

## 2018-12-30 DIAGNOSIS — L723 Sebaceous cyst: Secondary | ICD-10-CM | POA: Diagnosis not present

## 2019-04-24 ENCOUNTER — Telehealth: Payer: Self-pay | Admitting: Family Medicine

## 2019-04-24 NOTE — Telephone Encounter (Signed)
Noted  

## 2019-04-24 NOTE — Telephone Encounter (Signed)
Called pt b/c she had Medicare wellness and cpe coming up pt said she is no longer a patient of Dr. Diona Browner due to her not giving her antibiotics when she really needed them. She said she went to [facility] and found better care there and decided to switch. I removed Dr. Diona Browner as her pcp.

## 2019-04-24 NOTE — Telephone Encounter (Signed)
Not sure if you need to know the patient is no longer a patient?

## 2019-05-06 ENCOUNTER — Ambulatory Visit: Payer: Medicare Other

## 2019-05-13 ENCOUNTER — Encounter: Payer: Medicare Other | Admitting: Family Medicine

## 2019-05-20 ENCOUNTER — Other Ambulatory Visit: Payer: Self-pay | Admitting: Family Medicine

## 2019-05-24 ENCOUNTER — Other Ambulatory Visit: Payer: Self-pay | Admitting: Family Medicine

## 2019-08-18 ENCOUNTER — Other Ambulatory Visit: Payer: Self-pay | Admitting: Family Medicine

## 2019-08-18 DIAGNOSIS — Z1212 Encounter for screening for malignant neoplasm of rectum: Secondary | ICD-10-CM | POA: Diagnosis not present

## 2019-08-18 DIAGNOSIS — Z1211 Encounter for screening for malignant neoplasm of colon: Secondary | ICD-10-CM | POA: Diagnosis not present

## 2020-02-26 DIAGNOSIS — L739 Follicular disorder, unspecified: Secondary | ICD-10-CM | POA: Diagnosis not present

## 2020-03-10 ENCOUNTER — Telehealth: Payer: Self-pay

## 2020-03-10 NOTE — Telephone Encounter (Signed)
Pt received a call she said from [place] stating that it was time for her colonoscopy. Pt said she is not a patient of ours and do not wish to receive these calls. Please remove her number.

## 2020-03-12 NOTE — Telephone Encounter (Signed)
I have emailed the [facility] team and given them patient's info to remove from the [facility] call system.

## 2020-03-16 DIAGNOSIS — Z23 Encounter for immunization: Secondary | ICD-10-CM | POA: Diagnosis not present

## 2020-03-17 DIAGNOSIS — D225 Melanocytic nevi of trunk: Secondary | ICD-10-CM | POA: Diagnosis not present

## 2020-03-17 DIAGNOSIS — K219 Gastro-esophageal reflux disease without esophagitis: Secondary | ICD-10-CM | POA: Diagnosis not present

## 2020-03-17 DIAGNOSIS — M1710 Unilateral primary osteoarthritis, unspecified knee: Secondary | ICD-10-CM | POA: Diagnosis not present

## 2020-03-17 DIAGNOSIS — Z7185 Encounter for immunization safety counseling: Secondary | ICD-10-CM | POA: Diagnosis not present

## 2020-03-17 DIAGNOSIS — R7303 Prediabetes: Secondary | ICD-10-CM | POA: Diagnosis not present

## 2020-03-17 DIAGNOSIS — Z85828 Personal history of other malignant neoplasm of skin: Secondary | ICD-10-CM | POA: Diagnosis not present

## 2020-03-17 DIAGNOSIS — J4540 Moderate persistent asthma, uncomplicated: Secondary | ICD-10-CM | POA: Diagnosis not present

## 2020-03-17 DIAGNOSIS — L02421 Furuncle of right axilla: Secondary | ICD-10-CM | POA: Diagnosis not present

## 2020-03-17 DIAGNOSIS — D2272 Melanocytic nevi of left lower limb, including hip: Secondary | ICD-10-CM | POA: Diagnosis not present

## 2020-03-17 DIAGNOSIS — D2261 Melanocytic nevi of right upper limb, including shoulder: Secondary | ICD-10-CM | POA: Diagnosis not present

## 2020-03-17 DIAGNOSIS — L309 Dermatitis, unspecified: Secondary | ICD-10-CM | POA: Diagnosis not present

## 2020-04-19 DIAGNOSIS — R7303 Prediabetes: Secondary | ICD-10-CM | POA: Diagnosis not present

## 2020-04-23 DIAGNOSIS — Z23 Encounter for immunization: Secondary | ICD-10-CM | POA: Diagnosis not present

## 2020-04-27 DIAGNOSIS — Z1331 Encounter for screening for depression: Secondary | ICD-10-CM | POA: Diagnosis not present

## 2020-04-27 DIAGNOSIS — Z1339 Encounter for screening examination for other mental health and behavioral disorders: Secondary | ICD-10-CM | POA: Diagnosis not present

## 2020-04-27 DIAGNOSIS — Z0000 Encounter for general adult medical examination without abnormal findings: Secondary | ICD-10-CM | POA: Diagnosis not present

## 2020-05-03 ENCOUNTER — Other Ambulatory Visit: Payer: Self-pay | Admitting: Family Medicine

## 2020-05-03 DIAGNOSIS — N63 Unspecified lump in unspecified breast: Secondary | ICD-10-CM

## 2020-09-20 ENCOUNTER — Emergency Department (HOSPITAL_COMMUNITY): Payer: Medicare Other

## 2020-09-20 ENCOUNTER — Observation Stay (HOSPITAL_COMMUNITY)
Admission: EM | Admit: 2020-09-20 | Discharge: 2020-09-21 | Disposition: A | Discharge status: Home / Self Care | Payer: Medicare Other | Attending: Internal Medicine | Admitting: Internal Medicine

## 2020-09-20 ENCOUNTER — Encounter (HOSPITAL_COMMUNITY): Payer: Self-pay | Admitting: *Deleted

## 2020-09-20 ENCOUNTER — Other Ambulatory Visit: Payer: Self-pay

## 2020-09-20 DIAGNOSIS — J4540 Moderate persistent asthma, uncomplicated: Secondary | ICD-10-CM | POA: Diagnosis present

## 2020-09-20 DIAGNOSIS — Z87891 Personal history of nicotine dependence: Secondary | ICD-10-CM | POA: Insufficient documentation

## 2020-09-20 DIAGNOSIS — Z8673 Personal history of transient ischemic attack (TIA), and cerebral infarction without residual deficits: Secondary | ICD-10-CM | POA: Diagnosis not present

## 2020-09-20 DIAGNOSIS — R1084 Generalized abdominal pain: Secondary | ICD-10-CM | POA: Diagnosis present

## 2020-09-20 DIAGNOSIS — J45909 Unspecified asthma, uncomplicated: Secondary | ICD-10-CM | POA: Insufficient documentation

## 2020-09-20 DIAGNOSIS — Z91010 Allergy to peanuts: Secondary | ICD-10-CM | POA: Diagnosis not present

## 2020-09-20 DIAGNOSIS — K859 Acute pancreatitis without necrosis or infection, unspecified: Secondary | ICD-10-CM | POA: Diagnosis not present

## 2020-09-20 DIAGNOSIS — D72829 Elevated white blood cell count, unspecified: Secondary | ICD-10-CM | POA: Diagnosis not present

## 2020-09-20 DIAGNOSIS — I10 Essential (primary) hypertension: Secondary | ICD-10-CM | POA: Diagnosis present

## 2020-09-20 DIAGNOSIS — Z20822 Contact with and (suspected) exposure to covid-19: Secondary | ICD-10-CM | POA: Diagnosis not present

## 2020-09-20 DIAGNOSIS — Z6837 Body mass index (BMI) 37.0-37.9, adult: Secondary | ICD-10-CM

## 2020-09-20 DIAGNOSIS — R109 Unspecified abdominal pain: Secondary | ICD-10-CM | POA: Diagnosis present

## 2020-09-20 DIAGNOSIS — E785 Hyperlipidemia, unspecified: Secondary | ICD-10-CM | POA: Diagnosis present

## 2020-09-20 LAB — CBC WITH DIFFERENTIAL/PLATELET
Abs Immature Granulocytes: 0.13 10*3/uL — ABNORMAL HIGH (ref 0.00–0.07)
Basophils Absolute: 0 10*3/uL (ref 0.0–0.1)
Basophils Relative: 0 %
Eosinophils Absolute: 0.1 10*3/uL (ref 0.0–0.5)
Eosinophils Relative: 1 %
HCT: 50.2 % — ABNORMAL HIGH (ref 36.0–46.0)
Hemoglobin: 16.8 g/dL — ABNORMAL HIGH (ref 12.0–15.0)
Immature Granulocytes: 1 %
Lymphocytes Relative: 8 %
Lymphs Abs: 1.7 10*3/uL (ref 0.7–4.0)
MCH: 30.2 pg (ref 26.0–34.0)
MCHC: 33.5 g/dL (ref 30.0–36.0)
MCV: 90.3 fL (ref 80.0–100.0)
Monocytes Absolute: 1.6 10*3/uL — ABNORMAL HIGH (ref 0.1–1.0)
Monocytes Relative: 8 %
Neutro Abs: 17.5 10*3/uL — ABNORMAL HIGH (ref 1.7–7.7)
Neutrophils Relative %: 82 %
Platelets: 408 10*3/uL — ABNORMAL HIGH (ref 150–400)
RBC: 5.56 MIL/uL — ABNORMAL HIGH (ref 3.87–5.11)
RDW: 13.5 % (ref 11.5–15.5)
WBC: 21.1 10*3/uL — ABNORMAL HIGH (ref 4.0–10.5)
nRBC: 0 % (ref 0.0–0.2)

## 2020-09-20 LAB — COMPREHENSIVE METABOLIC PANEL
ALT: 27 U/L (ref 0–44)
AST: 19 U/L (ref 15–41)
Albumin: 3.8 g/dL (ref 3.5–5.0)
Alkaline Phosphatase: 70 U/L (ref 38–126)
Anion gap: 9 (ref 5–15)
BUN: 11 mg/dL (ref 8–23)
CO2: 29 mmol/L (ref 22–32)
Calcium: 10.2 mg/dL (ref 8.9–10.3)
Chloride: 96 mmol/L — ABNORMAL LOW (ref 98–111)
Creatinine, Ser: 0.82 mg/dL (ref 0.44–1.00)
GFR, Estimated: >60 mL/min (ref >60)
Glucose, Bld: 113 mg/dL — ABNORMAL HIGH (ref 70–99)
Potassium: 4.1 mmol/L (ref 3.5–5.1)
Sodium: 134 mmol/L — ABNORMAL LOW (ref 135–145)
Total Bilirubin: 0.8 mg/dL (ref 0.3–1.2)
Total Protein: 7.4 g/dL (ref 6.5–8.1)

## 2020-09-20 LAB — URINALYSIS, ROUTINE W REFLEX MICROSCOPIC
Bilirubin Urine: NEGATIVE
Glucose, UA: NEGATIVE mg/dL
Hgb urine dipstick: NEGATIVE
Ketones, ur: 20 mg/dL — AB
Nitrite: NEGATIVE
Protein, ur: NEGATIVE mg/dL
Specific Gravity, Urine: 1.016 (ref 1.005–1.030)
pH: 5 (ref 5.0–8.0)

## 2020-09-20 LAB — LIPASE, BLOOD: Lipase: 58 U/L — ABNORMAL HIGH (ref 11–51)

## 2020-09-20 MED ORDER — SODIUM CHLORIDE 0.9 % IV BOLUS
1000.0000 mL | Freq: Once | INTRAVENOUS | Status: AC
  Administered 2020-09-20: 1000 mL via INTRAVENOUS

## 2020-09-20 MED ORDER — FENTANYL CITRATE (PF) 100 MCG/2ML IJ SOLN
50.0000 ug | Freq: Once | INTRAMUSCULAR | Status: AC
  Administered 2020-09-21: 50 ug via INTRAVENOUS
  Filled 2020-09-20: qty 2

## 2020-09-20 NOTE — ED Triage Notes (Signed)
Emergency Medicine Provider Triage Evaluation Note  Katilyn Miltenberger , a 65 y.o. female  was evaluated in triage.  Pt complains of abd pain, abd bloating, nausea. Denies vomiting, diarrhea. Also c/o headache. Reports urinary frequency but denies dysuria.  Review of Systems  Positive: abd pain,  Negative: Vomiting, diarrhea  Physical Exam  BP (!) 177/81 (BP Location: Right Arm)   Pulse 80   Temp 99.7 F (37.6 C) (Oral)   Resp 20   SpO2 97%  Gen:   Awake, no distress   HEENT:  Atraumatic  Resp:  Normal effort  Cardiac:  Normal rate  Abd:   Nondistended, diffuse TTP MSK:   Moves extremities without difficulty  Neuro:  Speech clear   Medical Decision Making  Medically screening exam initiated at 5:50 PM.  Appropriate orders placed.  Dillie Burandt was informed that the remainder of the evaluation will be completed by another provider, this initial triage assessment does not replace that evaluation, and the importance of remaining in the ED until their evaluation is complete.  Clinical Impression   65 y/o f presents to the ed for eval of abd pain  MSE was initiated and I personally evaluated the patient and placed orders (if any) at  5:50 PM on September 20, 2020.  The patient appears stable so that the remainder of the MSE may be completed by another provider.Tivis Ringer, [place], PA-C 09/20/20 1750

## 2020-09-20 NOTE — ED Provider Notes (Signed)
[facility] Provider Note   CSN: [id number] Arrival date & time: 09/20/20  1628     History Chief Complaint  Patient presents with  . Abdominal Pain    [facility] is a 65 y.o. female.  The history is provided by the patient and medical records.  Abdominal Pain  C[facility] is a 65 y.o. female who presents to the Emergency Department complaining of abdominal pain.  She reports generalized abdominal bloating that started yesterday.  Has low back pain, poor sleep.  Has nausea.  Feels like she has to have a BM.  Has had two small BMs today.  No nausea.  No dysuria.  Has generalized, sharp, shooting abdominal pain. Symptoms are severe and constant nature.  Denies fever    Past Medical History:  Diagnosis Date  . Asthma   . Hemorrhoids   . UTI (lower urinary tract infection)     Patient Active Problem List   Diagnosis Date Noted  . Class 2 severe obesity due to excess calories with serious comorbidity and body mass index (BMI) of 37.0 to 37.9 in adult ([place]) 05/07/2018  . 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 External hemorrhoid 12/17/2014  . History of TIA (transient ischemic attack) 07/03/2013  . Hyperlipidemia 01/13/2009  . Allergic rhinitis 01/13/2009  . Asthma, moderate persistent 01/13/2009  . G E R D 01/13/2009    Past Surgical History:  Procedure Laterality Date  . BREAST BIOPSY    . KNEE SURGERY     x 5 surgeries  . MYOMECTOMY       OB History   No obstetric history on file.     Family History  Problem Relation Age of Onset  . Stroke Mother   . Hypertension Mother   . Heart disease Mother 51       massive MI  . Hypertension Father   . Heart disease Father   . Prostate cancer Father   . Breast cancer Maternal Aunt   . Cancer Maternal Aunt 40       breast cancer    Social History   Tobacco Use  . Smoking status: Former Smoker    Types: Cigarettes  . Smokeless tobacco: Never Used  Vaping Use  . Vaping  Use: Never used  Substance Use Topics  . Alcohol use: Yes    Alcohol/week: 0.0 standard drinks    Comment: occ  . Drug use: No    Home Medications Prior to Admission medications   Medication Sig Start Date End Date Taking? Authorizing Provider  acetaminophen (TYLENOL) 500 MG tablet Take 500 mg by mouth as needed for moderate pain or headache.   Yes [provider]  ADVAIR DISKUS 250-50 MCG/DOSE AEPB INHALE 1 PUFF INTO THE LUNGS TWICE DAILY Patient taking differently: Inhale 1 puff into the lungs in the morning and at bedtime. 05/17/18  Yes Bedsole, Amy E, MD  albuterol (PROVENTIL HFA;VENTOLIN HFA) 108 (90 BASE) MCG/ACT inhaler Inhale 2 puffs into the lungs every 6 (six) hours as needed for wheezing or shortness of breath. 12/17/14  Yes Bedsole, Amy E, MD  albuterol (PROVENTIL) (2.5 MG/3ML) 0.083% nebulizer solution Take 3 mLs (2.5 mg total) by nebulization every 6 (six) hours as needed for wheezing or shortness of breath. 12/17/14  Yes Bedsole, Amy E, MD  EPIPEN 2-PAK 0.3 MG/0.3ML SOAJ injection INJECT 0.3 MLS (0.3 MG TOTAL) INTO THE MUSCLE ONCE. Patient taking differently: Inject 0.3 mg into the muscle as needed for anaphylaxis. 06/13/18  Yes Bedsole, Amy E, MD  halobetasol (ULTRAVATE) 0.05 % cream APPLY TOPICALLY TO AFFECTED ITCHY AREAS 2 TIMES DAILY AS NEEDED Patient taking differently: Apply 1 application topically 2 (two) times daily as needed (rash). 04/30/18  Yes Bedsole, Amy E, MD  hydrocortisone (PROCTOSOL HC) 2.5 % rectal cream Place 1 application rectally 2 (two) times daily. Patient taking differently: Place 1 application rectally 2 (two) times daily as needed for hemorrhoids. 09/26/18  Yes Bedsole, Amy E, MD  montelukast (SINGULAIR) 10 MG tablet TAKE 1 TABLET BY MOUTH AT BEDTIME Patient taking differently: Take 10 mg by mouth daily. 05/06/18  Yes Bedsole, Amy E, MD  OVER THE COUNTER MEDICATION Take 1 tablet by mouth daily. vitafushio   Yes [provider]   pantoprazole (PROTONIX) 20 MG tablet TAKE 1 TABLET BY MOUTH EVERY DAY Patient taking differently: Take 20 mg by mouth daily. 05/21/18  Yes Bedsole, Amy E, MD  triamcinolone (NASACORT ALLERGY 24HR) 55 MCG/ACT AERO nasal inhaler Place 2 sprays into the nose daily. 07/03/13  Yes Bedsole, Amy E, MD    Allergies    Iodine, Peanut-containing drug products, Tetanus toxoid, Aspirin, and Prednisone  Review of Systems   Review of Systems  Gastrointestinal: Positive for abdominal pain.  All other systems reviewed and are negative.   Physical Exam Updated Vital Signs BP (!) 117/95   Pulse 88   Temp 99.5 F (37.5 C)   Resp 15   SpO2 95%   Physical Exam Vitals and nursing note reviewed.  Constitutional:      Appearance: She is well-developed.  HENT:     Head: Normocephalic and atraumatic.  Cardiovascular:     Rate and Rhythm: Normal rate and regular rhythm.  Pulmonary:     Effort: Pulmonary effort is normal. No respiratory distress.  Abdominal:     Tenderness: There is no guarding or rebound.     Comments: Mild abdominal distension with generalized tenderness, no guarding/rebound.   Musculoskeletal:        General: No tenderness.  Skin:    General: Skin is warm and dry.  Neurological:     Mental Status: She is alert and oriented to person, place, and time.  Psychiatric:        Behavior: Behavior normal.     ED Results / Procedures / Treatments   Labs (all labs ordered are listed, but only abnormal results are displayed) Labs Reviewed  COMPREHENSIVE METABOLIC PANEL - Abnormal; Notable for the following components:      Result Value   Sodium 134 (*)    Chloride 96 (*)    Glucose, Bld 113 (*)    All other components within normal limits  LIPASE, BLOOD - Abnormal; Notable for the following components:   Lipase 58 (*)    All other components within normal limits  CBC WITH DIFFERENTIAL/PLATELET - Abnormal; Notable for the following components:   WBC 21.1 (*)    RBC 5.56 (*)     Hemoglobin 16.8 (*)    HCT 50.2 (*)    Platelets 408 (*)    Neutro Abs 17.5 (*)    Monocytes Absolute 1.6 (*)    Abs Immature Granulocytes 0.13 (*)    All other components within normal limits  URINALYSIS, ROUTINE W REFLEX MICROSCOPIC - Abnormal; Notable for the following components:   Ketones, ur 20 (*)    Leukocytes,Ua SMALL (*)    Bacteria, UA RARE (*)    All other components within normal limits    EKG  None  Radiology CT Abdomen Pelvis Wo Contrast  Result Date: 09/20/2020 CLINICAL DATA:  Abdominal distension and pain EXAM: CT ABDOMEN AND PELVIS WITHOUT CONTRAST TECHNIQUE: Multidetector CT imaging of the abdomen and pelvis was performed following the standard protocol without IV contrast. COMPARISON:  None. FINDINGS: Lower chest: No acute abnormality. Hepatobiliary: Liver is well visualized within normal limits. Gallbladder is well distended with dependent gallstones. No definitive inflammatory changes are seen. Pancreas: Pancreas demonstrates diffuse fatty infiltration. The margins are mildly indistinct which may represent some very early inflammatory change. Spleen: Normal in size without focal abnormality. Adrenals/Urinary Tract: Adrenal glands are within normal limits. Kidneys demonstrate no renal calculi or obstructive changes. The ureters are within normal limits. Bladder is partially distended. Stomach/Bowel: Scattered diverticular change of the colon is noted without evidence of diverticulitis. The appendix is within normal limits. Small bowel and stomach appear unremarkable. Vascular/Lymphatic: Aortic atherosclerosis. No enlarged abdominal or pelvic lymph nodes. Reproductive: Uterus and bilateral adnexa are unremarkable. Other: No abdominal wall hernia or abnormality. No abdominopelvic ascites. Musculoskeletal: No acute or significant osseous findings. IMPRESSION: Suggestion of mild inflammatory changes within the pancreas which may represent some very early pancreatitis.  Cholelithiasis without complicating factors. No other focal abnormality is noted. Electronically Signed   By: Inez Catalina M.D.   On: 09/20/2020 22:25    Procedures Procedures   Medications Ordered in ED Medications  fentaNYL (SUBLIMAZE) injection 50 mcg (has no administration in time range)  sodium chloride 0.9 % bolus 1,000 mL (0 mLs Intravenous Stopped 09/20/20 2237)    ED Course  I have reviewed the triage vital signs and the nursing notes.  Pertinent labs & imaging results that were available during my care of the patient were reviewed by me and considered in my medical decision making (see chart for details).    MDM Rules/Calculators/A&P                         Patient here for evaluation of abdominal pain and bloating since yesterday. CBC was significant leukocytosis, hemal concentration. She did recently receive a steroid injection to the knee. CT abdomen pelvis obtained without contrast due to history of iodine allergy. CT is concerning for early pancreatitis. Patient initially declined any pain medications in the emergency department. On repeat assessment she is agreeable to pain medications due to ongoing pain. She has worsening pain and severe nausea with attempt to eat. Hospitalist consulted for admission for pancreatitis.  Final Clinical Impression(s) / ED Diagnoses Final diagnoses:  Acute biliary pancreatitis without infection or necrosis    Rx / DC Orders ED Discharge Orders    None       Quintella Reichert, MD 09/20/20 2330

## 2020-09-20 NOTE — ED Notes (Signed)
Patient transported to CT 

## 2020-09-20 NOTE — ED Notes (Signed)
Pt up to bathroom with minimal assistance 

## 2020-09-20 NOTE — ED Notes (Signed)
Pt given graham crackers and water. Will reassess once pt is finished.

## 2020-09-20 NOTE — ED Triage Notes (Signed)
Pt reports generalized abd pain since yesterday. Feels bloated. Has mid back pain that radiates around her back and has nausea. No vomiting.

## 2020-09-21 ENCOUNTER — Other Ambulatory Visit (HOSPITAL_COMMUNITY): Payer: Self-pay

## 2020-09-21 DIAGNOSIS — Z6837 Body mass index (BMI) 37.0-37.9, adult: Secondary | ICD-10-CM

## 2020-09-21 DIAGNOSIS — K859 Acute pancreatitis without necrosis or infection, unspecified: Secondary | ICD-10-CM | POA: Diagnosis not present

## 2020-09-21 DIAGNOSIS — R1084 Generalized abdominal pain: Secondary | ICD-10-CM | POA: Diagnosis not present

## 2020-09-21 DIAGNOSIS — I10 Essential (primary) hypertension: Secondary | ICD-10-CM

## 2020-09-21 DIAGNOSIS — J4540 Moderate persistent asthma, uncomplicated: Secondary | ICD-10-CM

## 2020-09-21 DIAGNOSIS — E7800 Pure hypercholesterolemia, unspecified: Secondary | ICD-10-CM

## 2020-09-21 DIAGNOSIS — R109 Unspecified abdominal pain: Secondary | ICD-10-CM | POA: Diagnosis present

## 2020-09-21 LAB — CBC
HCT: 45.5 % (ref 36.0–46.0)
Hemoglobin: 15.1 g/dL — ABNORMAL HIGH (ref 12.0–15.0)
MCH: 30 pg (ref 26.0–34.0)
MCHC: 33.2 g/dL (ref 30.0–36.0)
MCV: 90.5 fL (ref 80.0–100.0)
Platelets: 281 10*3/uL (ref 150–400)
RBC: 5.03 MIL/uL (ref 3.87–5.11)
RDW: 13.6 % (ref 11.5–15.5)
WBC: 16.2 10*3/uL — ABNORMAL HIGH (ref 4.0–10.5)
nRBC: 0 % (ref 0.0–0.2)

## 2020-09-21 LAB — COMPREHENSIVE METABOLIC PANEL
ALT: 22 U/L (ref 0–44)
AST: 15 U/L (ref 15–41)
Albumin: 3.2 g/dL — ABNORMAL LOW (ref 3.5–5.0)
Alkaline Phosphatase: 58 U/L (ref 38–126)
Anion gap: 8 (ref 5–15)
BUN: 10 mg/dL (ref 8–23)
CO2: 25 mmol/L (ref 22–32)
Calcium: 9.3 mg/dL (ref 8.9–10.3)
Chloride: 100 mmol/L (ref 98–111)
Creatinine, Ser: 0.71 mg/dL (ref 0.44–1.00)
GFR, Estimated: >60 mL/min (ref >60)
Glucose, Bld: 132 mg/dL — ABNORMAL HIGH (ref 70–99)
Potassium: 3.6 mmol/L (ref 3.5–5.1)
Sodium: 133 mmol/L — ABNORMAL LOW (ref 135–145)
Total Bilirubin: 0.8 mg/dL (ref 0.3–1.2)
Total Protein: 6.3 g/dL — ABNORMAL LOW (ref 6.5–8.1)

## 2020-09-21 LAB — SARS CORONAVIRUS 2 (TAT 6-24 HRS): SARS Coronavirus 2: NEGATIVE

## 2020-09-21 LAB — LIPASE, BLOOD: Lipase: 53 U/L — ABNORMAL HIGH (ref 11–51)

## 2020-09-21 LAB — HIV ANTIBODY (ROUTINE TESTING W REFLEX): HIV Screen 4th Generation wRfx: NONREACTIVE

## 2020-09-21 LAB — MAGNESIUM: Magnesium: 1.9 mg/dL (ref 1.7–2.4)

## 2020-09-21 LAB — PHOSPHORUS: Phosphorus: 2.8 mg/dL (ref 2.5–4.6)

## 2020-09-21 MED ORDER — ONDANSETRON HCL 4 MG/2ML IJ SOLN: 4.0000 mg | Freq: Four times a day (QID) | INTRAMUSCULAR | Status: DC | PRN

## 2020-09-21 MED ORDER — SENNOSIDES-DOCUSATE SODIUM 8.6-50 MG PO TABS: 1.0000 | ORAL_TABLET | Freq: Every evening | ORAL | Status: DC | PRN

## 2020-09-21 MED ORDER — LACTATED RINGERS IV SOLN: INTRAVENOUS | Status: AC

## 2020-09-21 MED ORDER — ALBUTEROL SULFATE HFA 108 (90 BASE) MCG/ACT IN AERS
2.0000 | INHALATION_SPRAY | Freq: Four times a day (QID) | RESPIRATORY_TRACT | Status: DC | PRN
  Filled 2020-09-21: qty 6.7

## 2020-09-21 MED ORDER — ONDANSETRON HCL 4 MG PO TABS
4.0000 mg | ORAL_TABLET | Freq: Four times a day (QID) | ORAL | 0 refills | Status: AC | PRN
  Filled 2020-09-21: qty 20, 5d supply, fill #0

## 2020-09-21 MED ORDER — ACETAMINOPHEN 325 MG PO TABS: 650.0000 mg | ORAL_TABLET | Freq: Four times a day (QID) | ORAL | Status: DC | PRN

## 2020-09-21 MED ORDER — LISINOPRIL 5 MG PO TABS
2.5000 mg | ORAL_TABLET | Freq: Every day | ORAL | Status: DC
  Administered 2020-09-21: 2.5 mg via ORAL
  Filled 2020-09-21: qty 1

## 2020-09-21 MED ORDER — LISINOPRIL 2.5 MG PO TABS
2.5000 mg | ORAL_TABLET | Freq: Every day | ORAL | 0 refills | Status: AC
  Filled 2020-09-21: qty 30, 30d supply, fill #0

## 2020-09-21 MED ORDER — ACETAMINOPHEN 650 MG RE SUPP: 650.0000 mg | Freq: Four times a day (QID) | RECTAL | Status: DC | PRN

## 2020-09-21 MED ORDER — MONTELUKAST SODIUM 10 MG PO TABS
10.0000 mg | ORAL_TABLET | Freq: Every day | ORAL | Status: DC
  Filled 2020-09-21: qty 1

## 2020-09-21 MED ORDER — SODIUM CHLORIDE 0.9% FLUSH
3.0000 mL | Freq: Two times a day (BID) | INTRAVENOUS | Status: DC
  Administered 2020-09-21 (×2): 3 mL via INTRAVENOUS

## 2020-09-21 MED ORDER — ONDANSETRON HCL 4 MG PO TABS: 4.0000 mg | ORAL_TABLET | Freq: Four times a day (QID) | ORAL | Status: DC | PRN

## 2020-09-21 MED ORDER — MOMETASONE FURO-FORMOTEROL FUM 200-5 MCG/ACT IN AERO
2.0000 | INHALATION_SPRAY | Freq: Two times a day (BID) | RESPIRATORY_TRACT | Status: DC
  Administered 2020-09-21: 2 via RESPIRATORY_TRACT
  Filled 2020-09-21: qty 8.8

## 2020-09-21 MED ORDER — ENOXAPARIN SODIUM 40 MG/0.4ML ~~LOC~~ SOLN: 40.0000 mg | SUBCUTANEOUS | Status: DC

## 2020-09-21 MED ORDER — MORPHINE SULFATE (PF) 2 MG/ML IV SOLN
1.0000 mg | INTRAVENOUS | Status: DC | PRN
Start: 2020-09-21 — End: 2020-09-21
  Administered 2020-09-21: 1 mg via INTRAVENOUS
  Filled 2020-09-21: qty 1

## 2020-09-21 NOTE — Progress Notes (Signed)
D/c instructions provided to pt. She has no questions at this time. TOC meds delivered to pt at bedside.

## 2020-09-21 NOTE — H&P (Signed)
History and Physical    [facility] KYH:[id number] DOB: [birth year] DOA: 09/20/2020  PCP: Fanny Bien, MD  Patient coming from: Home  I have personally briefly reviewed patient's old medical records in [place]  Chief Complaint: Abdominal pain, nausea  HPI: [facility] is a 65 y.o. female with medical history significant for asthma who presents to the ED for evaluation of abdominal pain.  Patient states yesterday morning she ate breakfast provided by her neighbor which was a spinach quiche.  About 2 hours later she felt as if her abdomen was swelling up.  She was having generalized abdominal pain with some radiation to both of her flanks and her back.  She has been having significant nausea without emesis.  She was able to take her medications and drink small amounts of liquids but otherwise no other significant oral intake.  She said she did recently have a steroid injection in her left knee and afterwards felt generally unwell with the burning sensation in her throat.  She otherwise denies any subjective fevers, chills, diaphoresis, dyspnea, chest pain, dysuria, and diarrhea.  ED Course:  Initial vitals showed BP 167/76, pulse 81, RR 20, temp 99.5 F, SPO2 98% on room air.  Labs show WBC 21.1, hemoglobin 16.8, hematocrit 50.2, platelets 408,000, sodium 134, potassium 4.1, bicarb 29, BUN 11, creatinine 0.82, serum glucose 113, LFTs within normal limits, lipase 58.  Urinalysis shows negative nitrites, small leukocytes, 0-5 RBC/hpf, 11-20 WBC/hpf, rare bacteria microscopy.  CT abdomen/pelvis without contrast showed mild inflammatory changes within the pancreas which may represent very early pancreatitis.  Cholelithiasis without complicating factors also noted.  Patient was given 1 L normal saline and IV fentanyl.  The hospitalist service was consulted to admit for further evaluation and management.  Review of Systems: All systems reviewed and are negative except as  documented in history of present illness above.   Past Medical History:  Diagnosis Date  . Asthma   . Hemorrhoids   . UTI (lower urinary tract infection)     Past Surgical History:  Procedure Laterality Date  . BREAST BIOPSY    . KNEE SURGERY     x 5 surgeries  . MYOMECTOMY      Social History:  reports that she has quit smoking. Her smoking use included cigarettes. She has never used smokeless tobacco. She reports current alcohol use. She reports that she does not use drugs.  Allergies  Allergen Reactions  . Iodine   . Peanut-Containing Drug Products   . Tetanus Toxoid   . Aspirin Swelling and Rash  . Prednisone Rash    Family History  Problem Relation Age of Onset  . Stroke Mother   . Hypertension Mother   . Heart disease Mother 96       massive MI  . Hypertension Father   . Heart disease Father   . Prostate cancer Father   . Breast cancer Maternal Aunt   . Cancer Maternal Aunt 40       breast cancer     Prior to Admission medications   Medication Sig Start Date End Date Taking? Authorizing Provider  acetaminophen (TYLENOL) 500 MG tablet Take 500 mg by mouth as needed for moderate pain or headache.   Yes [provider]  ADVAIR DISKUS 250-50 MCG/DOSE AEPB INHALE 1 PUFF INTO THE LUNGS TWICE DAILY Patient taking differently: Inhale 1 puff into the lungs in the morning and at bedtime. 05/17/18  Yes Bedsole, Amy E, MD  albuterol (PROVENTIL  HFA;VENTOLIN HFA) 108 (90 BASE) MCG/ACT inhaler Inhale 2 puffs into the lungs every 6 (six) hours as needed for wheezing or shortness of breath. 12/17/14  Yes Bedsole, Amy E, MD  albuterol (PROVENTIL) (2.5 MG/3ML) 0.083% nebulizer solution Take 3 mLs (2.5 mg total) by nebulization every 6 (six) hours as needed for wheezing or shortness of breath. 12/17/14  Yes Bedsole, Amy E, MD  EPIPEN 2-PAK 0.3 MG/0.3ML SOAJ injection INJECT 0.3 MLS (0.3 MG TOTAL) INTO THE MUSCLE ONCE. Patient taking differently: Inject 0.3 mg into the  muscle as needed for anaphylaxis. 06/13/18  Yes Bedsole, Amy E, MD  halobetasol (ULTRAVATE) 0.05 % cream APPLY TOPICALLY TO AFFECTED ITCHY AREAS 2 TIMES DAILY AS NEEDED Patient taking differently: Apply 1 application topically 2 (two) times daily as needed (rash). 04/30/18  Yes Bedsole, Amy E, MD  hydrocortisone (PROCTOSOL HC) 2.5 % rectal cream Place 1 application rectally 2 (two) times daily. Patient taking differently: Place 1 application rectally 2 (two) times daily as needed for hemorrhoids. 09/26/18  Yes Bedsole, Amy E, MD  montelukast (SINGULAIR) 10 MG tablet TAKE 1 TABLET BY MOUTH AT BEDTIME Patient taking differently: Take 10 mg by mouth daily. 05/06/18  Yes Bedsole, Amy E, MD  OVER THE COUNTER MEDICATION Take 1 tablet by mouth daily. vitafushio   Yes [provider]  pantoprazole (PROTONIX) 20 MG tablet TAKE 1 TABLET BY MOUTH EVERY DAY Patient taking differently: Take 20 mg by mouth daily. 05/21/18  Yes Bedsole, Amy E, MD  triamcinolone (NASACORT ALLERGY 24HR) 55 MCG/ACT AERO nasal inhaler Place 2 sprays into the nose daily. 07/03/13  Yes Jinny Sanders, MD    Physical Exam: Vitals:   09/20/20 2030 09/20/20 2149 09/20/20 2215 09/20/20 2245  BP: (!) 162/77 (!) 189/62 (!) 179/66 (!) 117/95  Pulse: 79 70 70 88  Resp: 16 15 18 15   Temp:      TempSrc:      SpO2: 97% 98% 98% 95%   Constitutional: Obese woman resting in bed, NAD, calm, comfortable Eyes: PERRL, lids and conjunctivae normal ENMT: Mucous membranes are moist. Posterior pharynx clear of any exudate or lesions.Normal dentition.  Neck: normal, supple, no masses. Respiratory: clear to auscultation bilaterally, no wheezing, no crackles. Normal respiratory effort. No accessory muscle use.  Cardiovascular: Regular rate and rhythm, no murmurs / rubs / gallops. No extremity edema. 2+ pedal pulses. Abdomen: no tenderness after receiving pain medication, no masses palpated. No hepatosplenomegaly. Bowel sounds positive.   Musculoskeletal: no clubbing / cyanosis. No joint deformity upper and lower extremities. Good ROM, no contractures. Normal muscle tone.  Skin: no rashes, lesions, ulcers. No induration Neurologic: CN 2-12 grossly intact. Sensation intact. Strength 5/5 in all 4.  Psychiatric: Normal judgment and insight. Alert and oriented x 3. Normal mood.   Labs on Admission: I have personally reviewed following labs and imaging studies  CBC: Recent Labs  Lab 09/20/20 1910  WBC 21.1*  NEUTROABS 17.5*  HGB 16.8*  HCT 50.2*  MCV 90.3  PLT 384*   Basic Metabolic Panel: Recent Labs  Lab 09/20/20 1910  NA 134*  K 4.1  CL 96*  CO2 29  GLUCOSE 113*  BUN 11  CREATININE 0.82  CALCIUM 10.2   GFR: CrCl cannot be calculated (Unknown ideal weight.). Liver Function Tests: Recent Labs  Lab 09/20/20 1910  AST 19  ALT 27  ALKPHOS 70  BILITOT 0.8  PROT 7.4  ALBUMIN 3.8   Recent Labs  Lab 09/20/20 1910  LIPASE 58*  No results for input(s): AMMONIA in the last 168 hours. Coagulation Profile: No results for input(s): INR, PROTIME in the last 168 hours. Cardiac Enzymes: No results for input(s): CKTOTAL, CKMB, CKMBINDEX, TROPONINI in the last 168 hours. BNP (last 3 results) No results for input(s): PROBNP in the last 8760 hours. HbA1C: No results for input(s): HGBA1C in the last 72 hours. CBG: No results for input(s): GLUCAP in the last 168 hours. Lipid Profile: No results for input(s): CHOL, HDL, LDLCALC, TRIG, CHOLHDL, LDLDIRECT in the last 72 hours. Thyroid Function Tests: No results for input(s): TSH, T4TOTAL, FREET4, T3FREE, THYROIDAB in the last 72 hours. Anemia Panel: No results for input(s): VITAMINB12, FOLATE, FERRITIN, TIBC, IRON, RETICCTPCT in the last 72 hours. Urine analysis:    Component Value Date/Time   COLORURINE YELLOW 09/20/2020 1750   APPEARANCEUR CLEAR 09/20/2020 1750   LABSPEC 1.016 09/20/2020 1750   PHURINE 5.0 09/20/2020 1750   GLUCOSEU NEGATIVE 09/20/2020  1750   HGBUR NEGATIVE 09/20/2020 1750   BILIRUBINUR NEGATIVE 09/20/2020 1750   KETONESUR 20 (A) 09/20/2020 1750   PROTEINUR NEGATIVE 09/20/2020 1750   NITRITE NEGATIVE 09/20/2020 1750   LEUKOCYTESUR SMALL (A) 09/20/2020 1750    Radiological Exams on Admission: CT Abdomen Pelvis Wo Contrast  Result Date: 09/20/2020 CLINICAL DATA:  Abdominal distension and pain EXAM: CT ABDOMEN AND PELVIS WITHOUT CONTRAST TECHNIQUE: Multidetector CT imaging of the abdomen and pelvis was performed following the standard protocol without IV contrast. COMPARISON:  None. FINDINGS: Lower chest: No acute abnormality. Hepatobiliary: Liver is well visualized within normal limits. Gallbladder is well distended with dependent gallstones. No definitive inflammatory changes are seen. Pancreas: Pancreas demonstrates diffuse fatty infiltration. The margins are mildly indistinct which may represent some very early inflammatory change. Spleen: Normal in size without focal abnormality. Adrenals/Urinary Tract: Adrenal glands are within normal limits. Kidneys demonstrate no renal calculi or obstructive changes. The ureters are within normal limits. Bladder is partially distended. Stomach/Bowel: Scattered diverticular change of the colon is noted without evidence of diverticulitis. The appendix is within normal limits. Small bowel and stomach appear unremarkable. Vascular/Lymphatic: Aortic atherosclerosis. No enlarged abdominal or pelvic lymph nodes. Reproductive: Uterus and bilateral adnexa are unremarkable. Other: No abdominal wall hernia or abnormality. No abdominopelvic ascites. Musculoskeletal: No acute or significant osseous findings. IMPRESSION: Suggestion of mild inflammatory changes within the pancreas which may represent some very early pancreatitis. Cholelithiasis without complicating factors. No other focal abnormality is noted. Electronically Signed   By: Inez Catalina M.D.   On: 09/20/2020 22:25    EKG: Not  performed.  Assessment/Plan Principal Problem:   Abdominal pain Active Problems:   Asthma, moderate persistent   [facility] is a 65 y.o. female with medical history significant for asthma who is admitted with abdominal pain and nausea.  Abdominal pain and nausea: With diminished oral intake.  CT A/P shows very mild inflammation of the pancreas and gallstones without evidence of cholecystitis or other complicating factors. LFTs are within normal limits. -Continue supportive care with IV fluids, analgesics and antiemetics as needed -Advance diet as tolerated however if abdominal pain recurs then would make strict n.p.o.  Leukocytosis: WBC, hemoglobin, hematocrit, and platelets are elevated suggestive of hemoconcentration.  Low suspicion for active bacterial infection at this time.  Asthma: Stable.  Continue Dulera, Singulair, albuterol as needed.  DVT prophylaxis: Lovenox Code Status: Full code, confirmed with patient Family Communication: Discussed with patient, she has discussed with family Disposition Plan: From home and likely discharge to home pending ability maintain  adequate oral intake Consults called: None  Level of care: Med-Surg Admission status:  Status is: Observation  The patient remains OBS appropriate and will d/c before 2 midnights.  Dispo: The patient is from: Home              Anticipated d/c is to: Home              Patient currently is not medically stable to d/c.   Difficult to place patient No Zada Finders MD [facility]  If 7PM-7AM, please contact night-coverage [email or web address]  09/21/2020, 12:24 AM

## 2020-09-21 NOTE — Progress Notes (Incomplete)
PROGRESS NOTE    Crystal Gonzalez  BSJ:[id number] DOB: 06-30-1955 DOA: 09/20/2020 PCP: Fanny Bien, MD     Brief Narrative:  Crystal Gonzalez is a 65 y.o. WF PMHx Asthma   Presents to the ED for evaluation of abdominal pain.  Patient states yesterday morning she ate breakfast provided by her neighbor which was a spinach quiche.  About 2 hours later she felt as if her abdomen was swelling up.  She was having generalized abdominal pain with some radiation to both of her flanks and her back.  She has been having significant nausea without emesis.  She was able to take her medications and drink small amounts of liquids but otherwise no other significant oral intake.  She said she did recently have a steroid injection in her left knee and afterwards felt generally unwell with the burning sensation in her throat.  She otherwise denies any subjective fevers, chills, diaphoresis, dyspnea, chest pain, dysuria, and diarrhea.  ED Course:  Initial vitals showed BP 167/76, pulse 81, RR 20, temp 99.5 F, SPO2 98% on room air.  Labs show WBC 21.1, hemoglobin 16.8, hematocrit 50.2, platelets 408,000, sodium 134, potassium 4.1, bicarb 29, BUN 11, creatinine 0.82, serum glucose 113, LFTs within normal limits, lipase 58.  Urinalysis shows negative nitrites, small leukocytes, 0-5 RBC/hpf, 11-20 WBC/hpf, rare bacteria microscopy.    Patient was given 1 L normal saline and IV fentanyl.  The hospitalist service was consulted to admit for further evaluation and management.  Review of Systems: All systems reviewed and are negative except as documented in history of present illness above.   Subjective: ***   Assessment & Plan: Covid vaccination;   Principal Problem:   Abdominal pain Active Problems:   Asthma, moderate persistent  Pancreatitis   Abdominal pain and nausea: With diminished oral intake.  CT A/P shows very mild inflammation of the pancreas and gallstones without evidence of  cholecystitis or other complicating factors. LFTs are within normal limits. -Continue supportive care with IV fluids, analgesics and antiemetics as needed -Advance diet as tolerated however if abdominal pain recurs then would make strict n.p.o.  Leukocytosis: WBC, hemoglobin, hematocrit, and platelets are elevated suggestive of hemoconcentration.  Low suspicion for active bacterial infection at this time.  Asthma: Stable.  Continue Dulera, Singulair, albuterol as needed.           There is no height or weight on file to calculate BMI.  ***   DVT prophylaxis: *** Code Status: *** Family Communication: *** Status is: Inpatient  {Inpatient:23812}  Dispo: The patient is from: {From:23814}              Anticipated d/c is to: {To:23815}              Anticipated d/c date is: {Days:23816}              Patient currently {Medically stable:23817}      Consultants:  ***  Procedures/Significant Events:  4/18 CT abdomen/pelvis without contrast showed mild inflammatory changes within the pancreas which may represent very early pancreatitis.  Cholelithiasis without complicating factors also noted.   I have personally reviewed and interpreted all radiology studies and my findings are as above.  VENTILATOR SETTINGS: ***   Cultures 4/19 SARS coronavirus negative   Antimicrobials: ***   Devices ***   LINES / TUBES:  ***    Continuous Infusions: . lactated ringers 125 mL/hr at 09/21/20 0218     Objective: Vitals:   09/21/20 0100 09/21/20 0145  09/21/20 0547 09/21/20 0808  BP: (!) 157/61 (!) 153/72 (!) 157/80   Pulse: 80 72 86 90  Resp: 19 19 18 20   Temp:  98.5 F (36.9 C) 98.8 F (37.1 C)   TempSrc:  Oral Oral   SpO2: 92%  94% 97%   No intake or output data in the 24 hours ending 09/21/20 0826 There were no vitals filed for this visit.  Examination:  General: No acute respiratory distress Eyes: negative scleral hemorrhage, negative anisocoria,  negative icterus*** ENT: Negative Runny nose, negative gingival bleeding,*** Neck:  Negative scars, masses, torticollis, lymphadenopathy, JVD*** Lungs: Clear to auscultation bilaterally without wheezes or crackles Cardiovascular: Regular rate and rhythm without murmur gallop or rub normal S1 and S2 Abdomen: negative abdominal pain, nondistended, positive soft, bowel sounds, no rebound, no ascites, no appreciable mass Extremities: No significant cyanosis, clubbing, or edema bilateral lower extremities Skin: Negative rashes, lesions, ulcers*** Psychiatric:  Negative depression, negative anxiety, negative fatigue, negative mania *** Central nervous system:  Cranial nerves II through XII intact, tongue/uvula midline, all extremities muscle strength 5/5, sensation intact throughout, finger nose finger bilateral within normal limits, quick finger touch bilateral within normal limits, negative Romberg sign, heel to shin bilateral within normal limits, standing on 1 foot bilateral within normal limits, walking on tiptoes within normal limits, walking on heels within normal limits, negative dysarthria, negative expressive aphasia, negative receptive aphasia.***  .     Data Reviewed: Care during the described time interval was provided by me .  I have reviewed this patient's available data, including medical history, events of note, physical examination, and all test results as part of my evaluation.  CBC: Recent Labs  Lab 09/20/20 1910 09/21/20 0230  WBC 21.1* 16.2*  NEUTROABS 17.5*  --   HGB 16.8* 15.1*  HCT 50.2* 45.5  MCV 90.3 90.5  PLT 408* 329   Basic Metabolic Panel: Recent Labs  Lab 09/20/20 1910 09/21/20 0230  NA 134* 133*  K 4.1 3.6  CL 96* 100  CO2 29 25  GLUCOSE 113* 132*  BUN 11 10  CREATININE 0.82 0.71  CALCIUM 10.2 9.3   GFR: CrCl cannot be calculated (Unknown ideal weight.). Liver Function Tests: Recent Labs  Lab 09/20/20 1910 09/21/20 0230  AST 19 15  ALT 27  22  ALKPHOS 70 58  BILITOT 0.8 0.8  PROT 7.4 6.3*  ALBUMIN 3.8 3.2*   Recent Labs  Lab 09/20/20 1910  LIPASE 58*   No results for input(s): AMMONIA in the last 168 hours. Coagulation Profile: No results for input(s): INR, PROTIME in the last 168 hours. Cardiac Enzymes: No results for input(s): CKTOTAL, CKMB, CKMBINDEX, TROPONINI in the last 168 hours. BNP (last 3 results) No results for input(s): PROBNP in the last 8760 hours. HbA1C: No results for input(s): HGBA1C in the last 72 hours. CBG: No results for input(s): GLUCAP in the last 168 hours. Lipid Profile: No results for input(s): CHOL, HDL, LDLCALC, TRIG, CHOLHDL, LDLDIRECT in the last 72 hours. Thyroid Function Tests: No results for input(s): TSH, T4TOTAL, FREET4, T3FREE, THYROIDAB in the last 72 hours. Anemia Panel: No results for input(s): VITAMINB12, FOLATE, FERRITIN, TIBC, IRON, RETICCTPCT in the last 72 hours. Sepsis Labs: No results for input(s): PROCALCITON, LATICACIDVEN in the last 168 hours.  Recent Results (from the past 240 hour(s))  SARS CORONAVIRUS 2 (TAT 6-24 HRS) Nasopharyngeal Nasopharyngeal Swab     Status: None   Collection Time: 09/21/20 12:42 AM   Specimen: Nasopharyngeal Swab  Result  Value Ref Range Status   SARS Coronavirus 2 NEGATIVE NEGATIVE Final    Comment: (NOTE) SARS-CoV-2 target nucleic acids are NOT DETECTED.  The SARS-CoV-2 RNA is generally detectable in upper and lower respiratory specimens during the acute phase of infection. Negative results do not preclude SARS-CoV-2 infection, do not rule out co-infections with other pathogens, and should not be used as the sole basis for treatment or other patient management decisions. Negative results must be combined with clinical observations, patient history, and epidemiological information. The expected result is Negative.  Fact Sheet for Patients: SugarRoll.be  Fact Sheet for Healthcare  Providers: https://www.woods-mathews.com/  This test is not yet approved or cleared by the [place] FDA and  has been authorized for detection and/or diagnosis of SARS-CoV-2 by FDA under an Emergency Use Authorization (EUA). This EUA will remain  in effect (meaning this test can be used) for the duration of the COVID-19 declaration under Se ction 564(b)(1) of the Act, 21 U.S.C. section 360bbb-3(b)(1), unless the authorization is terminated or revoked sooner.  Performed at [facility], [place] [address], [place], [place] [address]          Radiology Studies: CT Abdomen Pelvis Wo Contrast  Result Date: 09/20/2020 CLINICAL DATA:  Abdominal distension and pain EXAM: CT ABDOMEN AND PELVIS WITHOUT CONTRAST TECHNIQUE: Multidetector CT imaging of the abdomen and pelvis was performed following the standard protocol without IV contrast. COMPARISON:  None. FINDINGS: Lower chest: No acute abnormality. Hepatobiliary: Liver is well visualized within normal limits. Gallbladder is well distended with dependent gallstones. No definitive inflammatory changes are seen. Pancreas: Pancreas demonstrates diffuse fatty infiltration. The margins are mildly indistinct which may represent some very early inflammatory change. Spleen: Normal in size without focal abnormality. Adrenals/Urinary Tract: Adrenal glands are within normal limits. Kidneys demonstrate no renal calculi or obstructive changes. The ureters are within normal limits. Bladder is partially distended. Stomach/Bowel: Scattered diverticular change of the colon is noted without evidence of diverticulitis. The appendix is within normal limits. Small bowel and stomach appear unremarkable. Vascular/Lymphatic: Aortic atherosclerosis. No enlarged abdominal or pelvic lymph nodes. Reproductive: Uterus and bilateral adnexa are unremarkable. Other: No abdominal wall hernia or abnormality. No abdominopelvic ascites. Musculoskeletal: No acute or  significant osseous findings. IMPRESSION: Suggestion of mild inflammatory changes within the pancreas which may represent some very early pancreatitis. Cholelithiasis without complicating factors. No other focal abnormality is noted. Electronically Signed   By: Inez Catalina M.D.   On: 09/20/2020 22:25        Scheduled Meds: . enoxaparin (LOVENOX) injection  40 mg Subcutaneous Q24H  . mometasone-formoterol  2 puff Inhalation BID  . montelukast  10 mg Oral QHS  . sodium chloride flush  3 mL Intravenous Q12H   Continuous Infusions: . lactated ringers 125 mL/hr at 09/21/20 0218     LOS: 0 days    Time spent:40 min***    WOODS, Geraldo Docker, MD [facility]   If 7PM-7AM, please contact night-coverage 09/21/2020, 8:26 AM

## 2020-09-21 NOTE — Discharge Summary (Signed)
Physician Discharge Summary  Elide Stalzer DDU:[id number] DOB: 03-Oct-1955 DOA: 09/20/2020  PCP: Fanny Bien, MD  Admit date: 09/20/2020 Discharge date: 09/21/2020  Time spent: 58minutes  Recommendations for Outpatient Follow-up:   Covid vaccination; vaccinated 3/3    Pancreatitis -Mild case of pancreatitis has consumed some food today however was not a bland diet.  Was consuming tuna fish sandwich, eggs, which resulted in some mild postprandial pain.  Negative nausea, negative vomiting, negative diarrhea - Trend lipase Lab Results  Component Value Date   LIPASE 53 (H) 09/21/2020   LIPASE 58 (H) 09/20/2020  -Counseled patient that her lipase was mildly elevated -Patient counseled that she could go home as long as she stick to a BRAT diet for at least a week maybe more.  Negative alcohol consumption.  Patient agreed to this treatment plan.  Essential HTN - Lisinopril 2.5 mg daily  Leukocytosis - Most likely reactive/hemoconcentration patient with negative fever, negative bands, negative left shift  Asthma - No signs of exacerbation -.  Albuterol PRN - Dulera 200-5 mcg BID    Discharge Diagnoses:  Principal Problem:   Abdominal pain Active Problems:   Hyperlipidemia   Asthma, moderate persistent   Class 2 severe obesity due to excess calories with serious comorbidity and body mass index (BMI) of 37.0 to 37.9 in adult [facility])   Essential hypertension   Discharge Condition: Stable  Diet recommendation: BRAT ginger ale    History of present illness:  Crystal Gonzalez a 65 y.o.WF PMHx Asthma   Presents to the ED for evaluation of abdominal pain.Patient states yesterday morning she ate breakfast provided by her neighbor which was a spinach quiche. About 2 hours later she felt as if her abdomen was swelling up. She was having generalized abdominal pain with some radiation to both of her flanks and her back. She has been having significant nausea without  emesis. She was able to take her medications and drink small amounts of liquids but otherwise no other significant oral intake. She said she did recently have a steroid injection in her left knee and afterwards felt generally unwell with the burning sensation in her throat.  She otherwise denies any subjective fevers, chills, diaphoresis, dyspnea, chest pain, dysuria, and diarrhea.  Hospital Course:  See above  Procedures: 4/18 CT abdomen/pelvis without contrast showed mild inflammatory changes within the pancreas which may represent very early pancreatitis. Cholelithiasis without complicating factors also noted.    Discharge Exam: Vitals:   09/21/20 0145 09/21/20 0547 09/21/20 0808 09/21/20 1159  BP: (!) 153/72 (!) 157/80  (!) 149/68  Pulse: 72 86 90 82  Resp: 19 18 20 18   Temp: 98.5 F (36.9 C) 98.8 F (37.1 C)  99.6 F (37.6 C)  TempSrc: Oral Oral  Oral  SpO2:  94% 97% 96%   Physical Exam:  General: A/O x4 No acute respiratory distress Eyes: negative scleral hemorrhage, negative anisocoria, negative icterus ENT: Negative Runny nose, negative gingival bleeding, Neck:  Negative scars, masses, torticollis, lymphadenopathy, JVD Lungs: Clear to auscultation bilaterally without wheezes or crackles Cardiovascular: Regular rate and rhythm without murmur gallop or rub normal S1 and S2 Abdomen: Mild abdominal pain to palpation epigastric region, nondistended, positive soft, bowel sounds, no rebound, no ascites, no appreciable mass Extremities: No significant cyanosis, clubbing, or edema bilateral lower extremities Skin: Negative rashes, lesions, ulcers Psychiatric:  Negative depression, negative anxiety, negative fatigue, negative mania  Central nervous system:  Cranial nerves II through XII intact, tongue/uvula midline, all extremities muscle strength  5/5, sensation intact throughout, negative dysarthria, negative expressive aphasia, negative receptive aphasia.  Discharge  Instructions   Allergies as of 09/21/2020      Reactions   Iodine    Peanut-containing Drug Products    Tetanus Toxoid    Aspirin Swelling, Rash   Prednisone Rash      Medication List    TAKE these medications   acetaminophen 500 MG tablet Commonly known as: TYLENOL Take 500 mg by mouth as needed for moderate pain or headache.   Advair Diskus 250-50 MCG/DOSE Aepb Generic drug: Fluticasone-Salmeterol INHALE 1 PUFF INTO THE LUNGS TWICE DAILY What changed: See the new instructions.   albuterol (2.5 MG/3ML) 0.083% nebulizer solution Commonly known as: PROVENTIL Take 3 mLs (2.5 mg total) by nebulization every 6 (six) hours as needed for wheezing or shortness of breath.   albuterol 108 (90 Base) MCG/ACT inhaler Commonly known as: VENTOLIN HFA Inhale 2 puffs into the lungs every 6 (six) hours as needed for wheezing or shortness of breath.   EpiPen 2-Pak 0.3 mg/0.3 mL Soaj injection Generic drug: EPINEPHrine INJECT 0.3 MLS (0.3 MG TOTAL) INTO THE MUSCLE ONCE. What changed:   how much to take  how to take this  when to take this  reasons to take this  additional instructions   halobetasol 0.05 % cream Commonly known as: ULTRAVATE APPLY TOPICALLY TO AFFECTED ITCHY AREAS 2 TIMES DAILY AS NEEDED What changed:   how much to take  how to take this  when to take this  reasons to take this  additional instructions   hydrocortisone 2.5 % rectal cream Commonly known as: Proctosol HC Place 1 application rectally 2 (two) times daily. What changed:   when to take this  reasons to take this   lisinopril 2.5 MG tablet Commonly known as: ZESTRIL Take 1 tablet (2.5 mg total) by mouth daily.   montelukast 10 MG tablet Commonly known as: SINGULAIR TAKE 1 TABLET BY MOUTH AT BEDTIME What changed: when to take this   ondansetron 4 MG tablet Commonly known as: ZOFRAN Take 1 tablet (4 mg total) by mouth every 6 (six) hours as needed for nausea.   OVER THE COUNTER  MEDICATION Take 1 tablet by mouth daily. vitafushio   pantoprazole 20 MG tablet Commonly known as: PROTONIX TAKE 1 TABLET BY MOUTH EVERY DAY   triamcinolone 55 MCG/ACT Aero nasal inhaler Commonly known as: Nasacort Allergy 24HR Place 2 sprays into the nose daily.      Allergies  Allergen Reactions  . Iodine   . Peanut-Containing Drug Products   . Tetanus Toxoid   . Aspirin Swelling and Rash  . Prednisone Rash      The results of significant diagnostics from this hospitalization (including imaging, microbiology, ancillary and laboratory) are listed below for reference.    Significant Diagnostic Studies: CT Abdomen Pelvis Wo Contrast  Result Date: 09/20/2020 CLINICAL DATA:  Abdominal distension and pain EXAM: CT ABDOMEN AND PELVIS WITHOUT CONTRAST TECHNIQUE: Multidetector CT imaging of the abdomen and pelvis was performed following the standard protocol without IV contrast. COMPARISON:  None. FINDINGS: Lower chest: No acute abnormality. Hepatobiliary: Liver is well visualized within normal limits. Gallbladder is well distended with dependent gallstones. No definitive inflammatory changes are seen. Pancreas: Pancreas demonstrates diffuse fatty infiltration. The margins are mildly indistinct which may represent some very early inflammatory change. Spleen: Normal in size without focal abnormality. Adrenals/Urinary Tract: Adrenal glands are within normal limits. Kidneys demonstrate no renal calculi or obstructive changes. The  ureters are within normal limits. Bladder is partially distended. Stomach/Bowel: Scattered diverticular change of the colon is noted without evidence of diverticulitis. The appendix is within normal limits. Small bowel and stomach appear unremarkable. Vascular/Lymphatic: Aortic atherosclerosis. No enlarged abdominal or pelvic lymph nodes. Reproductive: Uterus and bilateral adnexa are unremarkable. Other: No abdominal wall hernia or abnormality. No abdominopelvic ascites.  Musculoskeletal: No acute or significant osseous findings. IMPRESSION: Suggestion of mild inflammatory changes within the pancreas which may represent some very early pancreatitis. Cholelithiasis without complicating factors. No other focal abnormality is noted. Electronically Signed   By: Inez Catalina M.D.   On: 09/20/2020 22:25    Microbiology: Recent Results (from the past 240 hour(s))  SARS CORONAVIRUS 2 (TAT 6-24 HRS) Nasopharyngeal Nasopharyngeal Swab     Status: None   Collection Time: 09/21/20 12:42 AM   Specimen: Nasopharyngeal Swab  Result Value Ref Range Status   SARS Coronavirus 2 NEGATIVE NEGATIVE Final    Comment: (NOTE) SARS-CoV-2 target nucleic acids are NOT DETECTED.  The SARS-CoV-2 RNA is generally detectable in upper and lower respiratory specimens during the acute phase of infection. Negative results do not preclude SARS-CoV-2 infection, do not rule out co-infections with other pathogens, and should not be used as the sole basis for treatment or other patient management decisions. Negative results must be combined with clinical observations, patient history, and epidemiological information. The expected result is Negative.  Fact Sheet for Patients: SugarRoll.be  Fact Sheet for Healthcare Providers: https://www.Anda Sobotta-mathews.com/  This test is not yet approved or cleared by the [place] FDA and  has been authorized for detection and/or diagnosis of SARS-CoV-2 by FDA under an Emergency Use Authorization (EUA). This EUA will remain  in effect (meaning this test can be used) for the duration of the COVID-19 declaration under Se ction 564(b)(1) of the Act, 21 U.S.C. section 360bbb-3(b)(1), unless the authorization is terminated or revoked sooner.  Performed at [facility], [place] [address], [place], [place] [address]      Labs: Basic Metabolic Panel: Recent Labs  Lab 09/20/20 1910 09/21/20 0230  NA 134*  133*  K 4.1 3.6  CL 96* 100  CO2 29 25  GLUCOSE 113* 132*  BUN 11 10  CREATININE 0.82 0.71  CALCIUM 10.2 9.3  MG  --  1.9  PHOS  --  2.8   Liver Function Tests: Recent Labs  Lab 09/20/20 1910 09/21/20 0230  AST 19 15  ALT 27 22  ALKPHOS 70 58  BILITOT 0.8 0.8  PROT 7.4 6.3*  ALBUMIN 3.8 3.2*   Recent Labs  Lab 09/20/20 1910 09/21/20 0230  LIPASE 58* 53*   No results for input(s): AMMONIA in the last 168 hours. CBC: Recent Labs  Lab 09/20/20 1910 09/21/20 0230  WBC 21.1* 16.2*  NEUTROABS 17.5*  --   HGB 16.8* 15.1*  HCT 50.2* 45.5  MCV 90.3 90.5  PLT 408* 281   Cardiac Enzymes: No results for input(s): CKTOTAL, CKMB, CKMBINDEX, TROPONINI in the last 168 hours. BNP: BNP (last 3 results) No results for input(s): BNP in the last 8760 hours.  ProBNP (last 3 results) No results for input(s): PROBNP in the last 8760 hours.  CBG: No results for input(s): GLUCAP in the last 168 hours.     Signed:  Dia Crawford, MD [facility]

## 2020-09-28 ENCOUNTER — Other Ambulatory Visit: Payer: Self-pay | Admitting: Family Medicine

## 2020-09-28 DIAGNOSIS — K8000 Calculus of gallbladder with acute cholecystitis without obstruction: Secondary | ICD-10-CM

## 2020-10-08 ENCOUNTER — Ambulatory Visit
Admission: RE | Admit: 2020-10-08 | Discharge: 2020-10-08 | Disposition: A | Discharge status: Home / Self Care | Payer: Medicare Other | Source: Ambulatory Visit | Attending: Family Medicine | Admitting: Family Medicine

## 2020-10-08 ENCOUNTER — Other Ambulatory Visit: Payer: Self-pay

## 2020-10-08 DIAGNOSIS — K8000 Calculus of gallbladder with acute cholecystitis without obstruction: Secondary | ICD-10-CM

## 2020-11-23 ENCOUNTER — Ambulatory Visit: Payer: Self-pay | Admitting: General Surgery

## 2020-11-23 ENCOUNTER — Encounter (HOSPITAL_COMMUNITY): Payer: Self-pay | Admitting: General Surgery

## 2020-11-23 ENCOUNTER — Other Ambulatory Visit: Payer: Self-pay

## 2020-11-23 NOTE — Progress Notes (Addendum)
COVID Vaccine Completed: Yes Date COVID Vaccine completed: x2 Has received booster:x2 COVID vaccine manufacturer: Pfizer     Date of COVID positive in last 90 days: No  PCP - Fanny Bien, MD Cardiologist - [facility] not since 2011  Neurologist-N/A  Chest x-ray - N/A EKG - N/A Stress Test - N/A ECHO - greater than 2 years  Cardiac Cath - N/A Pacemaker/ICD device last checked: N/A  Sleep Study - N/A CPAP - N/A  Fasting Blood Sugar - N/A Checks Blood Sugar __N/A___ times a day  Blood Thinner Instructions: N/A Aspirin Instructions: N/A Last Dose: N/A  Activity level:  Can go up a flight of stairs and activities of daily living without stopping and without symptoms       Anesthesia review: N/A  Patient denies shortness of breath, fever, cough and chest pain at PAT appointment   Patient verbalized understanding of instructions that were given to them at the PAT appointment. Patient was also instructed that they will need to review over the PAT instructions again at home before surgery.

## 2020-11-24 MED ORDER — SODIUM CHLORIDE 0.9 % IV SOLN: 2.0000 g | INTRAVENOUS | Status: DC

## 2020-11-25 ENCOUNTER — Encounter (HOSPITAL_COMMUNITY): Payer: Self-pay | Admitting: General Surgery

## 2020-11-25 ENCOUNTER — Ambulatory Visit (HOSPITAL_COMMUNITY): Payer: Medicare Other

## 2020-11-25 ENCOUNTER — Encounter (HOSPITAL_COMMUNITY)
Admission: RE | Disposition: A | Discharge status: Home / Self Care | Payer: Self-pay | Source: Home / Self Care | Attending: General Surgery

## 2020-11-25 ENCOUNTER — Ambulatory Visit (HOSPITAL_COMMUNITY)
Admission: RE | Admit: 2020-11-25 | Discharge: 2020-11-25 | Disposition: A | Discharge status: Home / Self Care | Payer: Medicare Other | Attending: General Surgery | Admitting: General Surgery

## 2020-11-25 DIAGNOSIS — Z888 Allergy status to other drugs, medicaments and biological substances status: Secondary | ICD-10-CM | POA: Insufficient documentation

## 2020-11-25 DIAGNOSIS — J45909 Unspecified asthma, uncomplicated: Secondary | ICD-10-CM | POA: Diagnosis not present

## 2020-11-25 DIAGNOSIS — Z8719 Personal history of other diseases of the digestive system: Secondary | ICD-10-CM | POA: Diagnosis not present

## 2020-11-25 DIAGNOSIS — Z8673 Personal history of transient ischemic attack (TIA), and cerebral infarction without residual deficits: Secondary | ICD-10-CM | POA: Insufficient documentation

## 2020-11-25 DIAGNOSIS — Z87891 Personal history of nicotine dependence: Secondary | ICD-10-CM | POA: Diagnosis not present

## 2020-11-25 DIAGNOSIS — K8020 Calculus of gallbladder without cholecystitis without obstruction: Secondary | ICD-10-CM | POA: Insufficient documentation

## 2020-11-25 DIAGNOSIS — Z7951 Long term (current) use of inhaled steroids: Secondary | ICD-10-CM | POA: Diagnosis not present

## 2020-11-25 DIAGNOSIS — Z79899 Other long term (current) drug therapy: Secondary | ICD-10-CM | POA: Insufficient documentation

## 2020-11-25 DIAGNOSIS — Z419 Encounter for procedure for purposes other than remedying health state, unspecified: Secondary | ICD-10-CM

## 2020-11-25 HISTORY — DX: Other skin changes: R23.8

## 2020-11-25 HISTORY — DX: Calculus of gallbladder without cholecystitis without obstruction: K80.20

## 2020-11-25 HISTORY — PX: CHOLECYSTECTOMY: SHX55

## 2020-11-25 HISTORY — DX: Pneumonia, unspecified organism: J18.9

## 2020-11-25 HISTORY — DX: Prediabetes: R73.03

## 2020-11-25 HISTORY — DX: Basal cell carcinoma of skin, unspecified: C44.91

## 2020-11-25 HISTORY — DX: Nausea with vomiting, unspecified: R11.2

## 2020-11-25 HISTORY — DX: Personal history of other medical treatment: Z92.89

## 2020-11-25 HISTORY — DX: Personal history of other diseases of the digestive system: Z87.19

## 2020-11-25 HISTORY — DX: Spontaneous ecchymoses: R23.3

## 2020-11-25 HISTORY — DX: Gastro-esophageal reflux disease without esophagitis: K21.9

## 2020-11-25 LAB — CBC WITH DIFFERENTIAL/PLATELET
Abs Immature Granulocytes: 0.01 10*3/uL (ref 0.00–0.07)
Basophils Absolute: 0.1 10*3/uL (ref 0.0–0.1)
Basophils Relative: 1 %
Eosinophils Absolute: 0.2 10*3/uL (ref 0.0–0.5)
Eosinophils Relative: 3 %
HCT: 42.8 % (ref 36.0–46.0)
Hemoglobin: 14.2 g/dL (ref 12.0–15.0)
Immature Granulocytes: 0 %
Lymphocytes Relative: 34 %
Lymphs Abs: 1.6 10*3/uL (ref 0.7–4.0)
MCH: 30.1 pg (ref 26.0–34.0)
MCHC: 33.2 g/dL (ref 30.0–36.0)
MCV: 90.9 fL (ref 80.0–100.0)
Monocytes Absolute: 0.5 10*3/uL (ref 0.1–1.0)
Monocytes Relative: 10 %
Neutro Abs: 2.5 10*3/uL (ref 1.7–7.7)
Neutrophils Relative %: 52 %
Platelets: 299 10*3/uL (ref 150–400)
RBC: 4.71 MIL/uL (ref 3.87–5.11)
RDW: 13.2 % (ref 11.5–15.5)
WBC: 4.8 10*3/uL (ref 4.0–10.5)
nRBC: 0 % (ref 0.0–0.2)

## 2020-11-25 LAB — COMPREHENSIVE METABOLIC PANEL
ALT: 26 U/L (ref 0–44)
AST: 26 U/L (ref 15–41)
Albumin: 4.1 g/dL (ref 3.5–5.0)
Alkaline Phosphatase: 58 U/L (ref 38–126)
Anion gap: 9 (ref 5–15)
BUN: 17 mg/dL (ref 8–23)
CO2: 23 mmol/L (ref 22–32)
Calcium: 9.8 mg/dL (ref 8.9–10.3)
Chloride: 105 mmol/L (ref 98–111)
Creatinine, Ser: 0.75 mg/dL (ref 0.44–1.00)
GFR, Estimated: >60 mL/min (ref >60)
Glucose, Bld: 90 mg/dL (ref 70–99)
Potassium: 3.8 mmol/L (ref 3.5–5.1)
Sodium: 137 mmol/L (ref 135–145)
Total Bilirubin: 0.4 mg/dL (ref 0.3–1.2)
Total Protein: 7.2 g/dL (ref 6.5–8.1)

## 2020-11-25 SURGERY — LAPAROSCOPIC CHOLECYSTECTOMY
Anesthesia: General | Site: Abdomen

## 2020-11-25 MED ORDER — 0.9 % SODIUM CHLORIDE (POUR BTL) OPTIME
TOPICAL | Status: DC | PRN
  Administered 2020-11-25: 1000 mL

## 2020-11-25 MED ORDER — TRAMADOL HCL 50 MG PO TABS: 50.0000 mg | ORAL_TABLET | Freq: Once | ORAL | Status: DC

## 2020-11-25 MED ORDER — MIDAZOLAM HCL 2 MG/2ML IJ SOLN
INTRAMUSCULAR | Status: AC
  Filled 2020-11-25: qty 2

## 2020-11-25 MED ORDER — TRAMADOL HCL 50 MG PO TABS
ORAL_TABLET | ORAL | Status: AC
  Administered 2020-11-25: 50 mg via ORAL
  Filled 2020-11-25: qty 1

## 2020-11-25 MED ORDER — ACETAMINOPHEN 500 MG PO TABS
1000.0000 mg | ORAL_TABLET | ORAL | Status: AC
  Administered 2020-11-25: 1000 mg via ORAL
  Filled 2020-11-25: qty 2

## 2020-11-25 MED ORDER — ROCURONIUM BROMIDE 100 MG/10ML IV SOLN
INTRAVENOUS | Status: DC | PRN
  Administered 2020-11-25: 20 mg via INTRAVENOUS
  Administered 2020-11-25: 50 mg via INTRAVENOUS
  Administered 2020-11-25: 20 mg via INTRAVENOUS

## 2020-11-25 MED ORDER — MIDAZOLAM HCL 2 MG/2ML IJ SOLN
INTRAMUSCULAR | Status: DC | PRN
  Administered 2020-11-25: 2 mg via INTRAVENOUS

## 2020-11-25 MED ORDER — TRAMADOL HCL 50 MG PO TABS: 50.0000 mg | ORAL_TABLET | Freq: Four times a day (QID) | ORAL | 0 refills | Status: AC | PRN

## 2020-11-25 MED ORDER — ONDANSETRON HCL 4 MG/2ML IJ SOLN
INTRAMUSCULAR | Status: AC
  Filled 2020-11-25: qty 2

## 2020-11-25 MED ORDER — IOHEXOL 300 MG/ML  SOLN
INTRAMUSCULAR | Status: DC | PRN
  Administered 2020-11-25: 5.5 mL

## 2020-11-25 MED ORDER — CHLORHEXIDINE GLUCONATE 0.12 % MT SOLN
15.0000 mL | Freq: Once | OROMUCOSAL | Status: AC
  Administered 2020-11-25: 15 mL via OROMUCOSAL

## 2020-11-25 MED ORDER — DROPERIDOL 2.5 MG/ML IJ SOLN
INTRAMUSCULAR | Status: AC
  Filled 2020-11-25: qty 4

## 2020-11-25 MED ORDER — FENTANYL CITRATE (PF) 100 MCG/2ML IJ SOLN
INTRAMUSCULAR | Status: AC
  Filled 2020-11-25: qty 2

## 2020-11-25 MED ORDER — PROPOFOL 10 MG/ML IV BOLUS
INTRAVENOUS | Status: DC | PRN
  Administered 2020-11-25: 150 mg via INTRAVENOUS

## 2020-11-25 MED ORDER — LACTATED RINGERS IV SOLN: INTRAVENOUS | Status: DC

## 2020-11-25 MED ORDER — FENTANYL CITRATE (PF) 100 MCG/2ML IJ SOLN
INTRAMUSCULAR | Status: DC | PRN
  Administered 2020-11-25 (×2): 50 ug via INTRAVENOUS
  Administered 2020-11-25: 100 ug via INTRAVENOUS

## 2020-11-25 MED ORDER — BUPIVACAINE-EPINEPHRINE 0.25% -1:200000 IJ SOLN
INTRAMUSCULAR | Status: DC | PRN
  Administered 2020-11-25: 20 mL

## 2020-11-25 MED ORDER — BUPIVACAINE-EPINEPHRINE (PF) 0.25% -1:200000 IJ SOLN
INTRAMUSCULAR | Status: AC
  Filled 2020-11-25: qty 30

## 2020-11-25 MED ORDER — CHLORHEXIDINE GLUCONATE CLOTH 2 % EX PADS: 6.0000 | MEDICATED_PAD | Freq: Once | CUTANEOUS | Status: DC

## 2020-11-25 MED ORDER — FENTANYL CITRATE (PF) 100 MCG/2ML IJ SOLN
25.0000 ug | INTRAMUSCULAR | Status: DC | PRN
  Administered 2020-11-25: 50 ug via INTRAVENOUS
  Administered 2020-11-25: 25 ug via INTRAVENOUS

## 2020-11-25 MED ORDER — DROPERIDOL 2.5 MG/ML IJ SOLN
0.6250 mg | Freq: Once | INTRAMUSCULAR | Status: AC | PRN
  Administered 2020-11-25: 0.625 mg via INTRAVENOUS

## 2020-11-25 MED ORDER — ORAL CARE MOUTH RINSE: 15.0000 mL | Freq: Once | OROMUCOSAL | Status: AC

## 2020-11-25 MED ORDER — SODIUM CHLORIDE 0.9 % IV SOLN
2.0000 g | INTRAVENOUS | Status: AC
  Administered 2020-11-25: 2 g via INTRAVENOUS
  Filled 2020-11-25: qty 2

## 2020-11-25 MED ORDER — PROPOFOL 10 MG/ML IV BOLUS
INTRAVENOUS | Status: AC
  Filled 2020-11-25: qty 20

## 2020-11-25 MED ORDER — TRAMADOL HCL 50 MG PO TABS: 50.0000 mg | ORAL_TABLET | Freq: Once | ORAL | Status: AC | PRN

## 2020-11-25 MED ORDER — LIDOCAINE 2% (20 MG/ML) 5 ML SYRINGE
INTRAMUSCULAR | Status: AC
  Filled 2020-11-25: qty 5

## 2020-11-25 MED ORDER — ACETAMINOPHEN 500 MG PO TABS: 1000.0000 mg | ORAL_TABLET | Freq: Three times a day (TID) | ORAL | 0 refills | Status: AC

## 2020-11-25 MED ORDER — LIDOCAINE 2% (20 MG/ML) 5 ML SYRINGE
INTRAMUSCULAR | Status: DC | PRN
  Administered 2020-11-25: 60 mg via INTRAVENOUS

## 2020-11-25 MED ORDER — KETOROLAC TROMETHAMINE 30 MG/ML IJ SOLN
INTRAMUSCULAR | Status: AC
  Filled 2020-11-25: qty 1

## 2020-11-25 MED ORDER — LACTATED RINGERS IR SOLN
Status: DC | PRN
  Administered 2020-11-25: 1000 mL

## 2020-11-25 MED ORDER — SUGAMMADEX SODIUM 200 MG/2ML IV SOLN
INTRAVENOUS | Status: DC | PRN
  Administered 2020-11-25: 200 mg via INTRAVENOUS

## 2020-11-25 MED ORDER — DEXAMETHASONE SODIUM PHOSPHATE 10 MG/ML IJ SOLN
INTRAMUSCULAR | Status: DC | PRN
  Administered 2020-11-25: 4 mg via INTRAVENOUS

## 2020-11-25 MED ORDER — KETOROLAC TROMETHAMINE 15 MG/ML IJ SOLN
INTRAMUSCULAR | Status: DC | PRN
  Administered 2020-11-25: 15 mg via INTRAVENOUS

## 2020-11-25 MED ORDER — MEPERIDINE HCL 50 MG/ML IJ SOLN: 6.2500 mg | INTRAMUSCULAR | Status: DC | PRN

## 2020-11-25 MED ORDER — ROCURONIUM BROMIDE 10 MG/ML (PF) SYRINGE
PREFILLED_SYRINGE | INTRAVENOUS | Status: AC
  Filled 2020-11-25: qty 10

## 2020-11-25 SURGICAL SUPPLY — 48 items
APPLICATOR ARISTA FLEXITIP XL (MISCELLANEOUS) IMPLANT
APPLIER CLIP 5 13 M/L LIGAMAX5 (MISCELLANEOUS)
APPLIER CLIP ROT 10 11.4 M/L (STAPLE)
BAG COUNTER SPONGE SURGICOUNT (BAG) IMPLANT
BENZOIN TINCTURE PRP APPL 2/3 (GAUZE/BANDAGES/DRESSINGS) IMPLANT
BNDG ADH 1X3 SHEER STRL LF (GAUZE/BANDAGES/DRESSINGS) IMPLANT
CABLE HIGH FREQUENCY MONO STRZ (ELECTRODE) ×2 IMPLANT
CHLORAPREP W/TINT 26 (MISCELLANEOUS) ×2 IMPLANT
CLIP APPLIE 5 13 M/L LIGAMAX5 (MISCELLANEOUS) IMPLANT
CLIP APPLIE ROT 10 11.4 M/L (STAPLE) IMPLANT
CLIP VESOLOCK MED LG 6/CT (CLIP) IMPLANT
COVER MAYO STAND STRL (DRAPES) IMPLANT
COVER SURGICAL LIGHT HANDLE (MISCELLANEOUS) ×2 IMPLANT
DECANTER SPIKE VIAL GLASS SM (MISCELLANEOUS) IMPLANT
DERMABOND ADVANCED (GAUZE/BANDAGES/DRESSINGS) ×1
DERMABOND ADVANCED .7 DNX12 (GAUZE/BANDAGES/DRESSINGS) ×1 IMPLANT
DRAPE C-ARM 42X120 X-RAY (DRAPES) ×2 IMPLANT
DRSG TEGADERM 2-3/8X2-3/4 SM (GAUZE/BANDAGES/DRESSINGS) IMPLANT
ELECT REM PT RETURN 15FT ADLT (MISCELLANEOUS) ×2 IMPLANT
GAUZE SPONGE 2X2 8PLY STRL LF (GAUZE/BANDAGES/DRESSINGS) IMPLANT
GLOVE SURG POLY ORTHO LF SZ7.5 (GLOVE) ×2 IMPLANT
GOWN STRL REUS W/TWL XL LVL3 (GOWN DISPOSABLE) ×6 IMPLANT
GRASPER SUT TROCAR 14GX15 (MISCELLANEOUS) IMPLANT
HEMOSTAT ARISTA ABSORB 3G PWDR (HEMOSTASIS) IMPLANT
HEMOSTAT SNOW SURGICEL 2X4 (HEMOSTASIS) IMPLANT
KIT BASIN OR (CUSTOM PROCEDURE TRAY) ×2 IMPLANT
KIT TURNOVER KIT A (KITS) ×2 IMPLANT
L-HOOK LAP DISP 36CM (ELECTROSURGICAL)
LHOOK LAP DISP 36CM (ELECTROSURGICAL) IMPLANT
POUCH RETRIEVAL ECOSAC 10 (ENDOMECHANICALS) ×1 IMPLANT
POUCH RETRIEVAL ECOSAC 10MM (ENDOMECHANICALS) ×1
SCISSORS LAP 5X35 DISP (ENDOMECHANICALS) ×2 IMPLANT
SET CHOLANGIOGRAPH MIX (MISCELLANEOUS) ×2 IMPLANT
SET IRRIG TUBING LAPAROSCOPIC (IRRIGATION / IRRIGATOR) ×2 IMPLANT
SET TUBE SMOKE EVAC HIGH FLOW (TUBING) ×2 IMPLANT
SLEEVE XCEL OPT CAN 5 100 (ENDOMECHANICALS) ×4 IMPLANT
SPONGE GAUZE 2X2 STER 10/PKG (GAUZE/BANDAGES/DRESSINGS)
STRIP CLOSURE SKIN 1/2X4 (GAUZE/BANDAGES/DRESSINGS) ×2 IMPLANT
SUT MNCRL AB 4-0 PS2 18 (SUTURE) ×2 IMPLANT
SUT VIC AB 0 UR5 27 (SUTURE) IMPLANT
SUT VICRYL 0 TIES 12 18 (SUTURE) IMPLANT
SUT VICRYL 0 UR6 27IN ABS (SUTURE) IMPLANT
TOWEL OR 17X26 10 PK STRL BLUE (TOWEL DISPOSABLE) ×2 IMPLANT
TOWEL OR NON WOVEN STRL DISP B (DISPOSABLE) ×2 IMPLANT
TRAY LAPAROSCOPIC (CUSTOM PROCEDURE TRAY) ×2 IMPLANT
TROCAR BLADELESS OPT 5 100 (ENDOMECHANICALS) ×2 IMPLANT
TROCAR XCEL BLUNT TIP 100MML (ENDOMECHANICALS) IMPLANT
TROCAR XCEL NON-BLD 11X100MML (ENDOMECHANICALS) IMPLANT

## 2020-11-25 NOTE — Anesthesia Procedure Notes (Signed)
Procedure Name: Intubation Date/Time: 11/25/2020 9:47 AM Performed by: Niel Hummer, CRNA Pre-anesthesia Checklist: Patient identified, Emergency Drugs available, Suction available, Patient being monitored and Timeout performed Patient Re-evaluated:Patient Re-evaluated prior to induction Oxygen Delivery Method: Circle system utilized Preoxygenation: Pre-oxygenation with 100% oxygen Induction Type: IV induction Ventilation: Oral airway inserted - appropriate to patient size and Mask ventilation with difficulty Laryngoscope Size: Mac and 3 Grade View: Grade I Tube size: 7.0 mm Number of attempts: 1 Airway Equipment and Method: Stylet and Oral airway Placement Confirmation: ETT inserted through vocal cords under direct vision, positive ETCO2 and breath sounds checked- equal and bilateral Secured at: 21 cm Tube secured with: Tape Dental Injury: Teeth and Oropharynx as per pre-operative assessment  Comments: DL x1 Charyl Bigger, SRNA

## 2020-11-25 NOTE — Interval H&P Note (Signed)
History and Physical Interval Note:  11/25/2020 9:25 AM  Crystal Gonzalez  has presented today for surgery, with the diagnosis of SYMPTOMATIC CHOLELITHIASIS, HISTORY OF PANCREATITIS.  The various methods of treatment have been discussed with the patient and family. After consideration of risks, benefits and other options for treatment, the patient has consented to  Procedure(s): LAPAROSCOPIC CHOLECYSTECTOMY WITH IOC (N/A) as a surgical intervention.  The patient's history has been reviewed, patient examined, no change in status, stable for surgery.  I have reviewed the patient's chart and labs.  Questions were answered to the patient's satisfaction.    Leighton Ruff. Redmond Pulling, MD, FACS General, Bariatric, & Minimally Invasive Surgery [facility]   Greer Pickerel

## 2020-11-25 NOTE — Anesthesia Preprocedure Evaluation (Addendum)
Anesthesia Evaluation  Patient identified by MRN, date of birth, ID band Patient awake    Reviewed: Allergy & Precautions, NPO status , Patient's Chart, lab work & pertinent test results  History of Anesthesia Complications (+) PONV and history of anesthetic complications  Airway Mallampati: II  TM Distance: >3 FB Neck ROM: Full    Dental no notable dental hx. (+) Dental Advisory Given, Teeth Intact   Pulmonary asthma , pneumonia, resolved, former smoker,    Pulmonary exam normal breath sounds clear to auscultation       Cardiovascular hypertension, Normal cardiovascular exam Rhythm:Regular Rate:Normal     Neuro/Psych TIA   GI/Hepatic Neg liver ROS, GERD  ,  Endo/Other  negative endocrine ROS  Renal/GU negative Renal ROS     Musculoskeletal negative musculoskeletal ROS (+)   Abdominal (+) + obese,   Peds  Hematology negative hematology ROS (+)   Anesthesia Other Findings   Reproductive/Obstetrics                            Anesthesia Physical Anesthesia Plan  ASA: 3  Anesthesia Plan: General   Post-op Pain Management:    Induction: Intravenous  PONV Risk Score and Plan: 4 or greater and Ondansetron, Dexamethasone, Midazolam, Treatment may vary due to age or medical condition and Diphenhydramine  Airway Management Planned: Oral ETT  Additional Equipment: None  Intra-op Plan:   Post-operative Plan: Extubation in OR  Informed Consent: I have reviewed the patients History and Physical, chart, labs and discussed the procedure including the risks, benefits and alternatives for the proposed anesthesia with the patient or authorized representative who has indicated his/her understanding and acceptance.     Dental advisory given  Plan Discussed with: CRNA  Anesthesia Plan Comments:        Anesthesia Quick Evaluation

## 2020-11-25 NOTE — Transfer of Care (Addendum)
Immediate Anesthesia Transfer of Care Note  Patient: Crystal Gonzalez  Procedure(s) Performed: LAPAROSCOPIC CHOLECYSTECTOMY WITH IOC (Abdomen)  Patient Location: PACU  Anesthesia Type:General  Level of Consciousness: awake, alert  and oriented  Airway & Oxygen Therapy: Patient Spontanous Breathing and Patient connected to face mask oxygen  Post-op Assessment: Report given to RN, Post -op Vital signs reviewed and stable and Patient moving all extremities X 4  Post vital signs: Reviewed and stable  Last Vitals:  Vitals Value Taken Time  BP 167/86   Temp    Pulse 59   Resp 11 11/25/20 1104  SpO2 100   Vitals shown include unvalidated device data.  Last Pain:  Vitals:   11/25/20 0804  TempSrc:   PainSc: 4          Complications: No notable events documented.

## 2020-11-25 NOTE — Op Note (Signed)
Crystal Gonzalez [id number] 1956/05/27 11/25/2020  Laparoscopic Cholecystectomy with IOC Procedure Note  Indications: This patient presents with symptomatic gallbladder disease and will undergo laparoscopic cholecystectomy.  Patient had a mild episode of gallstone pancreatitis  Pre-operative Diagnosis: Symptomatic cholelithiasis, history of mild gallstone pancreatitis  Post-operative Diagnosis: same  Surgeon: Greer Pickerel MD FACS  Assistants: none  Anesthesia: General endotracheal anesthesia   Procedure Details  The patient was seen again in the Holding Room. The risks, benefits, complications, treatment options, and expected outcomes were discussed with the patient. The possibilities of reaction to medication, pulmonary aspiration, perforation of viscus, bleeding, recurrent infection, finding a normal gallbladder, the need for additional procedures, failure to diagnose a condition, the possible need to convert to an open procedure, and creating a complication requiring transfusion or operation were discussed with the patient. The likelihood of improving the patient's symptoms with return to their baseline status is good.  The patient and/or family concurred with the proposed plan, giving informed consent. The site of surgery properly noted. The patient was taken to Operating Room, identified as Crystal Gonzalez and the procedure verified as Laparoscopic Cholecystectomy with Intraoperative Cholangiogram. A Time Out was held and the above information confirmed. Antibiotic prophylaxis was administered.   Prior to the induction of general anesthesia, antibiotic prophylaxis was administered. General endotracheal anesthesia was then administered and tolerated well. After the induction, the abdomen was prepped with Chloraprep and draped in the sterile fashion. The patient was positioned in the supine position.  Local anesthetic agent was injected into the skin near the umbilicus and an incision made. We  dissected down to the abdominal fascia with blunt dissection.  The fascia was incised vertically and we entered the peritoneal cavity bluntly.  A pursestring suture of 0-Vicryl was placed around the fascial opening.  The Hasson cannula was inserted and secured with the stay suture.  Pneumoperitoneum was then created with CO2 and tolerated well without any adverse changes in the patient's vital signs. An 5-mm port was placed in the subxiphoid position.  Two 5-mm ports were placed in the right upper quadrant. All skin incisions were infiltrated with a local anesthetic agent before making the incision and placing the trocars.   We positioned the patient in reverse Trendelenburg, tilted slightly to the patient's left.  The gallbladder was identified, the fundus grasped and retracted cephalad.  There was spillage of bile from the gallbladder when we retracted it.  Adhesions were lysed bluntly and with the electrocautery where indicated, taking care not to injure any adjacent organs or viscus. The infundibulum was grasped and retracted laterally, exposing the peritoneum overlying the triangle of Calot. This was then divided and exposed in a blunt fashion. A critical view of the cystic duct and cystic artery was obtained.  The cystic duct was clearly identified and bluntly dissected circumferentially. The cystic duct was ligated with a clip distally.   An incision was made in the cystic duct and the [facility] cholangiogram catheter introduced. The catheter was secured using a clip. A cholangiogram was then obtained which showed good visualization of the distal and proximal biliary tree with no sign of filling defects or obstruction.  Contrast flowed easily into the duodenum. The catheter was then removed.   The cystic duct was then ligated with clips and divided. The cystic artery which had been identified & dissected free was ligated with clips and divided as well.   The gallbladder was dissected from the liver bed in  retrograde fashion with the electrocautery. The  gallbladder was removed and placed in an Ecco sac.  The gallbladder and Ecco sac were then removed through the umbilical port site. The liver bed was irrigated and inspected. Hemostasis was achieved with the electrocautery. Copious irrigation was utilized and was repeatedly aspirated until clear.  The pursestring suture was used to close the umbilical fascia.  I did place an additional interrupted 0 Vicryl at the umbilical fascia with a PMI suture passer  We again inspected the right upper quadrant for hemostasis.  The umbilical closure was inspected and there was no air leak and nothing trapped within the closure. Pneumoperitoneum was released as we removed the trocars.  4-0 Monocryl was used to close the skin.   Dermabond was applied. The patient was then extubated and brought to the recovery room in stable condition. Instrument, sponge, and needle counts were correct at closure and at the conclusion of the case.   Findings: Cholecystitis with Cholelithiasis Nml IOC Estimated Blood Loss: Minimal         Drains: none         Specimens: Gallbladder           Complications: None; patient tolerated the procedure well.         Disposition: PACU - hemodynamically stable.         Condition: stable  Crystal Ruff. Redmond Pulling, MD, FACS General, Bariatric, & Minimally Invasive Surgery [facility], [place]

## 2020-11-25 NOTE — H&P (Signed)
ALLY KNODEL Appointment: 11/19/2020 10:30 AM Location: [facility] Patient #: [id number] DOB: [birth year] Widowed / Language: Cleophus Molt / Race: [place]ogg[place] Female   History of Present Illness Randall Hiss M. Lenora Gomes MD; 11/25/2020 6:56 AM) The patient is a 65 year old female who presents for evaluation of gall stones. pt is referred by Dr Benjamine Mola dewey for evaluation for gallbladder after a minor bout of pancreatitis prompting a trip to the ED around 4/18.  she was discharged the following day. She states that she got some prednisone and injected her knee and then around [place] she had a sip of beer and developed stomach swelling.  She states that her stomach went up and down and she had severe discomfort.  She presented to the ED which showed mild pancreatitis and gallstones.  Koziol count was 21,000 and then decreased to 16.  She has had some intermittent additional discomfort but not near as bad as the episode around Easter.  No current nausea or vomiting.  She is also had some intermittent right back pain since 2016.  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  No current TIAs or amaurosis fugax.  hospital & ed course: Eunique Balik is a 65 y.o. female with medical history significant for asthma who presents to the ED for evaluation of abdominal pain.  Patient states yesterday morning she ate breakfast provided by her neighbor which was a spinach quiche.  About 2 hours later she felt as if her abdomen was swelling up.  She was having generalized abdominal pain with some radiation to both of her flanks and her back.  She has been having significant nausea without emesis.  She was able to take her medications and drink small amounts of liquids but otherwise no other significant oral intake.  She said she did recently have a steroid injection in her left knee and afterwards felt generally unwell with the burning sensation in her throat.  She otherwise denies any subjective fevers, chills, diaphoresis, dyspnea,  chest pain, dysuria, and diarrhea.  ED Course: Initial vitals showed BP 167/76, pulse 81, RR 20, temp 99.5 F, SPO2 98% on room air.  Labs show WBC 21.1, hemoglobin 16.8, hematocrit 50.2, platelets 408,000, sodium 134, potassium 4.1, bicarb 29, BUN 11, creatinine 0.82, serum glucose 113, LFTs within normal limits, lipase 58.  Urinalysis shows negative nitrites, small leukocytes, 0-5 RBC/hpf, 11-20 WBC/hpf, rare bacteria microscopy.  CT abdomen/pelvis without contrast showed mild inflammatory changes within the pancreas which may represent very early pancreatitis.  Cholelithiasis without complicating factors also noted.    Problem List/Past Medical Randall Hiss M. Redmond Pulling, MD; 11/25/2020 6:57 AM) HISTORY OF PANCREATITIS (Z87.19)   SYMPTOMATIC CHOLELITHIASIS (K80.20)    Past Surgical History (Charmella Little, CNA; 11/19/2020 10:23 AM) Cesarean Section - 1    Diagnostic Studies History (Charmella Little, CNA; 11/19/2020 10:23 AM) Mammogram   within last year Pap Smear   1-5 years ago  Allergies (Charmella Little, CNA; 11/19/2020 10:24 AM) predniSONE *CORTICOSTEROIDS*    Medication History (Charmella Little, CNA; 11/19/2020 10:25 AM) Advair Diskus  (250-50MCG/ACT Aero Pow Br Act, Inhalation) Active. Albuterol Sulfate HFA  (108 (90 Base)MCG/ACT Aerosol Soln, Inhalation) Active. Azithromycin  (250MG  Tablet, Oral) Active. Diclofenac Sodium  (1% Gel, External) Active. Doxycycline Hyclate  (100MG  Capsule, Oral) Active. Halobetasol Propionate  (0.05% Cream, External) Active. Lisinopril  (2.5MG  Tablet, Oral) Active. Montelukast Sodium  (10MG  Tablet, Oral) Active. Mupirocin  (2% Ointment, External) Active. Ondansetron HCl  (4MG  Tablet, Oral) Active. Pantoprazole Sodium  (20MG  Tablet DR, Oral)  Active. Pantoprazole Sodium  (40MG  Tablet DR, Oral) Active. Triamcinolone Acetonide  (0.1% Cream, External) Active.  Social History (Charmella Little, CNA; 11/19/2020 10:23 AM) Alcohol use   Occasional alcohol  use. No caffeine use   No drug use   Tobacco use   Former smoker.  Family History (Charmella Little, CNA; 11/19/2020 10:23 AM) Depression   Sister. Heart Disease   Father, Mother. Heart disease in female family member before age 10   Hypertension   Father, Mother. Ischemic Bowel Disease   Sister. Melanoma   Sister. Prostate Cancer   Father.  Pregnancy / Birth History (Charmella Little, CNA; 11/19/2020 10:23 AM) Age of menopause   28-50  Other Problems Randall Hiss M. Redmond Pulling, MD; 11/25/2020 6:57 AM) Cholelithiasis      Review of Systems (Charmella Little CNA; 11/19/2020 10:23 AM) General Present- Night Sweats. Not Present- Appetite Loss, Chills, Fatigue, Fever, Weight Gain and Weight Loss. HEENT Present- Wears glasses/contact lenses. Not Present- Earache, Hearing Loss, Hoarseness, Nose Bleed, Oral Ulcers, Ringing in the Ears, Seasonal Allergies, Sinus Pain, Sore Throat, Visual Disturbances and Yellow Eyes. Cardiovascular Not Present- Chest Pain, Difficulty Breathing Lying Down, Leg Cramps, Palpitations, Rapid Heart Rate, Shortness of Breath and Swelling of Extremities. Hematology Present- Easy Bruising and Excessive bleeding. Not Present- Blood Thinners, Gland problems, HIV and Persistent Infections.  Vitals (Charmella Little CNA; 11/19/2020 10:25 AM) 11/19/2020 10:25 AM Weight: 172.5 lb   Height: 59 in  Body Surface Area: 1.73 m   Body Mass Index: 34.84 kg/m   Temp.: 97.9 F    Pulse: 73 (Regular)    P.OX: 91% (Room air) BP: 138/62(Sitting, Left Arm, Standard)       Physical Exam Randall Hiss M. Veronia Laprise MD; 11/25/2020 6:49 AM) General Mental Status - Alert. General Appearance - Consistent with stated age. Hydration - Well hydrated. Voice - Normal.  Head and Neck Head - normocephalic, atraumatic with no lesions or palpable masses. Trachea - midline. Thyroid Gland Characteristics - normal size and consistency.  Eye Eyeball - Bilateral - Extraocular movements  intact. Sclera/Conjunctiva - Bilateral - No scleral icterus.  Chest and Lung Exam Chest and lung exam reveals  - quiet, even and easy respiratory effort with no use of accessory muscles and on auscultation, normal breath sounds, no adventitious sounds and normal vocal resonance. Inspection Chest Wall - Normal. Back - normal.  Breast - Did not examine.  Cardiovascular Cardiovascular examination reveals  - normal heart sounds, regular rate and rhythm with no murmurs and normal pedal pulses bilaterally.  Abdomen Inspection Inspection of the abdomen reveals - No Hernias. Skin - Scar - no surgical scars. Palpation/Percussion Palpation and Percussion of the abdomen reveal - Soft, Non Tender, No Rebound tenderness, No Rigidity (guarding) and No hepatosplenomegaly. Auscultation Auscultation of the abdomen reveals - Bowel sounds normal.  Peripheral Vascular Upper Extremity Palpation - Pulses bilaterally normal.  Neurologic Neurologic evaluation reveals  - alert and oriented x 3 with no impairment of recent or remote memory. Mental Status - Normal.  Neuropsychiatric The patient's mood and affect are described as  - normal. Judgment and Insight - insight is appropriate concerning matters relevant to self.  Musculoskeletal Normal Exam - Left - Upper Extremity Strength Normal and Lower Extremity Strength Normal. Normal Exam - Right - Upper Extremity Strength Normal and Lower Extremity Strength Normal.  Lymphatic Head & Neck  General Head & Neck Lymphatics: Bilateral - Description - Normal. Axillary - Did not examine. Femoral & Inguinal - Did not examine.    Assessment &  Plan Randall Hiss M. Salimah Martinovich MD; 11/25/2020 6:57 AM) SYMPTOMATIC CHOLELITHIASIS (K80.20) Impression: I believe the patient's symptoms are consistent with gallbladder disease.  We discussed gallbladder disease. The patient was given Neurosurgeon. We discussed non-operative and operative management. We discussed the  signs & symptoms of acute cholecystitis  I discussed laparoscopic cholecystectomy with IOC in detail. The patient was given educational material as well as diagrams detailing the procedure. We discussed the risks and benefits of a laparoscopic cholecystectomy including, but not limited to bleeding, infection, injury to surrounding structures such as the intestine or liver, bile leak, retained gallstones, need to convert to an open procedure, prolonged diarrhea, blood clots such as DVT, common bile duct injury, anesthesia risks, and possible need for additional procedures. We discussed the typical post-operative recovery course. I explained that the likelihood of improvement of their symptoms is good.  The patient has elected to proceed with surgery. I did tell the patient I was unsure whether or not cholecystectomy would ameliorate her right back pain that has been present for about 8 years  This patient encounter took 30 minutes on the day of visit to perform the following: take history, perform exam, review outside records, interpret imaging, counsel the patient on their diagnosis Current Plans Pt Education - Pamphlet Given - Laparoscopic Gallbladder Surgery: discussed with patient and provided information. You are being scheduled for surgery - Our schedulers will call you.   You should hear from our office's scheduling department within 5 working days about the location, date, and time of surgery.  We try to make accommodations for patient's preferences in scheduling surgery, but sometimes the OR schedule or the surgeon's schedule prevents [place] from making those accommodations.  If you have not heard from our office [phone]) in 5 working days, call the office and ask for your surgeon's nurse.  If you have other questions about your diagnosis, plan, or surgery, call the office and ask for your surgeon's nurse.  HISTORY OF PANCREATITIS (Z87.19)  Leighton Ruff. Redmond Pulling, MD, FACS General, Bariatric, &  Minimally Invasive Surgery [facility], [place]

## 2020-11-25 NOTE — Discharge Instructions (Addendum)
[facility] LAPAROSCOPIC SURGERY: POST OP INSTRUCTIONS Always review your discharge instruction sheet given to you by the facility where your surgery was performed. IF YOU HAVE DISABILITY OR FAMILY LEAVE FORMS, YOU MUST BRING THEM TO THE OFFICE FOR PROCESSING.   DO NOT GIVE THEM TO YOUR DOCTOR.  PAIN CONTROL  First take acetaminophen (Tylenol) AND/or ibuprofen (Advil) to control your pain after surgery.  Follow directions on package.  Taking acetaminophen (Tylenol) and/or ibuprofen (Advil) regularly after surgery will help to control your pain and lower the amount of prescription pain medication you may need.  You should not take more than 3,000 mg (3 grams) of acetaminophen (Tylenol) in 24 hours.  You should not take ibuprofen (Advil), aleve, motrin, naprosyn or other NSAIDS if you have a history of stomach ulcers or chronic kidney disease.  A prescription for pain medication may be given to you upon discharge.  Take your pain medication as prescribed, if you still have uncontrolled pain after taking acetaminophen (Tylenol) or ibuprofen (Advil). Use ice packs to help control pain. If you need a refill on your pain medication, please contact your pharmacy.  They will contact our office to request authorization. Prescriptions will not be filled after 5pm or on week-ends.  HOME MEDICATIONS Take your usually prescribed medications unless otherwise directed.  DIET You should follow a light diet the first few days after arrival home.  Be sure to include lots of fluids daily. Avoid fatty, fried foods.   CONSTIPATION It is common to experience some constipation after surgery and if you are taking pain medication.  Increasing fluid intake and taking a stool softener (such as Colace) will usually help or prevent this problem from occurring.  A mild laxative (Milk of Magnesia or Miralax) should be taken according to package instructions if there are no bowel movements after 48  hours.  WOUND/INCISION CARE Most patients will experience some swelling and bruising in the area of the incisions.  Ice packs will help.  Swelling and bruising can take several days to resolve.  Unless discharge instructions indicate otherwise, follow guidelines below  STERI-STRIPS - you may remove your outer bandages 48 hours after surgery, and you may shower at that time.  You have steri-strips (small skin tapes) in place directly over the incision.  These strips should be left on the skin for 7-10 days.   DERMABOND/SKIN GLUE - you may shower in 24 hours.  The glue will flake off over the next 2-3 weeks. Any sutures or staples will be removed at the office during your follow-up visit.  ACTIVITIES You may resume regular (light) daily activities beginning the next day--such as daily self-care, walking, climbing stairs--gradually increasing activities as tolerated.  You may have sexual intercourse when it is comfortable.  Refrain from any heavy lifting or straining until approved by your doctor. You may drive when you are no longer taking prescription pain medication, you can comfortably wear a seatbelt, and you can safely maneuver your car and apply brakes.  FOLLOW-UP You should see your doctor in the office for a follow-up appointment approximately 2-3 weeks after your surgery.  You should have been given your post-op/follow-up appointment when your surgery was scheduled.  If you did not receive a post-op/follow-up appointment, make sure that you call for this appointment within a day or two after you arrive home to insure a convenient appointment time.  OTHER INSTRUCTIONS   WHEN TO CALL YOUR DOCTOR: Fever over 101.0 Inability to urinate Continued   bleeding from incision. Increased pain, redness, or drainage from the incision. Increasing abdominal pain  The clinic staff is available to answer your questions during regular business hours.  Please don't hesitate to call and ask to speak to  one of the nurses for clinical concerns.  If you have a medical emergency, go to the nearest emergency room or call 911.  A surgeon from [facility] is always on call at the hospital. [address], [address], [place], [place]  [address] ? [address] [address], [place], [place]   [address] [phone] ? [phone] ? FAX [phone] Web site: [email or web address]     Managing Your Pain After Surgery Without Opioids    Thank you for participating in our program to help patients manage their pain after surgery without opioids. This is part of our effort to provide you with the best care possible, without exposing you or your family to the risk that opioids pose.  What pain can I expect after surgery? You can expect to have some pain after surgery. This is normal. The pain is typically worse the day after surgery, and quickly begins to get better. Many studies have found that many patients are able to manage their pain after surgery with Over-the-Counter (OTC) medications such as Tylenol and Motrin. If you have a condition that does not allow you to take Tylenol or Motrin, notify your surgical team.  How will I manage my pain? The best strategy for controlling your pain after surgery is around the clock pain control with Tylenol (acetaminophen) and Motrin (ibuprofen or Advil). Alternating these medications with each other allows you to maximize your pain control. In addition to Tylenol and Motrin, you can use heating pads or ice packs on your incisions to help reduce your pain.  How will I alternate your regular strength over-the-counter pain medication? You will take a dose of pain medication every three hours. Start by taking 650 mg of Tylenol (2 pills of 325 mg) 3 hours later take 600 mg of Motrin (3 pills of 200 mg) 3 hours after taking the Motrin take 650 mg of Tylenol 3 hours after that take 600 mg of Motrin.   - 1 -  See example - if your first dose of  Tylenol is at 12:00 PM   12:00 PM Tylenol 650 mg (2 pills of 325 mg)  3:00 PM Motrin 600 mg (3 pills of 200 mg)  6:00 PM Tylenol 650 mg (2 pills of 325 mg)  9:00 PM Motrin 600 mg (3 pills of 200 mg)  Continue alternating every 3 hours   We recommend that you follow this schedule around-the-clock for at least 3 days after surgery, or until you feel that it is no longer needed. Use the table on the last page of this handout to keep track of the medications you are taking. Important: Do not take more than 3000mg of Tylenol or 1600mg of Motrin in a 24-hour period. Do not take ibuprofen/Motrin if you have a history of bleeding stomach ulcers, severe kidney disease, &/or actively taking a blood thinner  What if I still have pain? If you have pain that is not controlled with the over-the-counter pain medications (Tylenol and Motrin or Advil) you might have what we call "breakthrough" pain. You will receive a prescription for a small amount of an opioid pain medication such as Oxycodone, Tramadol, or Tylenol with Codeine. Use these opioid pills in the first 24 hours after surgery if you have breakthrough   pain. Do not take more than 1 pill every 4-6 hours.  If you still have uncontrolled pain after using all opioid pills, don't hesitate to call our staff using the number provided. We will help make sure you are managing your pain in the best way possible, and if necessary, we can provide a prescription for additional pain medication.   Day 1    Time  Name of Medication Number of pills taken  Amount of Acetaminophen  Pain Level   Comments  AM PM       AM PM       AM PM       AM PM       AM PM       AM PM       AM PM       AM PM       Total Daily amount of Acetaminophen Do not take more than  3,000 mg per day      Day 2    Time  Name of Medication Number of pills taken  Amount of Acetaminophen  Pain Level   Comments  AM PM       AM PM       AM PM       AM PM       AM PM        AM PM       AM PM       AM PM       Total Daily amount of Acetaminophen Do not take more than  3,000 mg per day      Day 3    Time  Name of Medication Number of pills taken  Amount of Acetaminophen  Pain Level   Comments  AM PM       AM PM       AM PM       AM PM          AM PM       AM PM       AM PM       AM PM       Total Daily amount of Acetaminophen Do not take more than  3,000 mg per day      Day 4    Time  Name of Medication Number of pills taken  Amount of Acetaminophen  Pain Level   Comments  AM PM       AM PM       AM PM       AM PM       AM PM       AM PM       AM PM       AM PM       Total Daily amount of Acetaminophen Do not take more than  3,000 mg per day      Day 5    Time  Name of Medication Number of pills taken  Amount of Acetaminophen  Pain Level   Comments  AM PM       AM PM       AM PM       AM PM       AM PM       AM PM       AM PM       AM PM       Total Daily amount of Acetaminophen Do not take more   than  3,000 mg per day       Day 6    Time  Name of Medication Number of pills taken  Amount of Acetaminophen  Pain Level  Comments  AM PM       AM PM       AM PM       AM PM       AM PM       AM PM       AM PM       AM PM       Total Daily amount of Acetaminophen Do not take more than  3,000 mg per day      Day 7    Time  Name of Medication Number of pills taken  Amount of Acetaminophen  Pain Level   Comments  AM PM       AM PM       AM PM       AM PM       AM PM       AM PM       AM PM       AM PM       Total Daily amount of Acetaminophen Do not take more than  3,000 mg per day        For additional information about how and where to safely dispose of unused opioid medications - [employer]  Disclaimer: This document contains information and/or instructional materials adapted from [place] for the typical patient with your condition. It does not  replace medical advice from your health care provider because your experience may differ from that of the typical patient. Talk to your health care provider if you have any questions about this document, your condition or your treatment plan. Adapted from [place]   The drugs that you were given will stay in your system until tomorrow so for the next 24 hours you should not:  Drive an automobile Make any legal decisions Drink any alcoholic beverage   You may resume regular meals tomorrow.  Today it is better to start with liquids and gradually work up to solid foods.  You may eat anything you prefer, but it is better to start with liquids, then soup and crackers, and gradually work up to solid foods.   Please notify your doctor immediately if you have any unusual bleeding, trouble breathing, redness and pain at the surgery site, drainage, fever, or pain not relieved by medication.    Additional Instructions:        Please contact your physician with any problems or Same Day Surgery at [phone], Monday through Friday 6 am to 4 pm, or [place] [facility] number at [phone].

## 2020-11-26 ENCOUNTER — Encounter (HOSPITAL_COMMUNITY): Payer: Self-pay | Admitting: General Surgery

## 2020-11-26 LAB — SURGICAL PATHOLOGY

## 2020-11-26 NOTE — Anesthesia Postprocedure Evaluation (Signed)
Anesthesia Post Note  Patient: Crystal Gonzalez  Procedure(s) Performed: LAPAROSCOPIC CHOLECYSTECTOMY WITH IOC (Abdomen)     Patient location during evaluation: PACU Anesthesia Type: General Level of consciousness: sedated and patient cooperative Pain management: pain level controlled Vital Signs Assessment: post-procedure vital signs reviewed and stable Respiratory status: spontaneous breathing Cardiovascular status: stable Anesthetic complications: no   No notable events documented.  Last Vitals:  Vitals:   11/25/20 1214 11/25/20 1251  BP: 139/69 122/83  Pulse: (!) 55 68  Resp: 14 14  Temp: (!) 36.4 C   SpO2: 96% 100%    Last Pain:  Vitals:   11/25/20 1251  TempSrc:   PainSc: [place]

## 2021-02-24 ENCOUNTER — Other Ambulatory Visit (HOSPITAL_COMMUNITY): Payer: Self-pay

## 2021-04-27 ENCOUNTER — Other Ambulatory Visit (HOSPITAL_COMMUNITY): Payer: Self-pay

## 2021-09-09 IMAGING — CT CT ABD-PELV W/O CM
2 of 4 series · 16 of 46 positions shown, 18 images · non-contrast
Comparison: None.

CLINICAL DATA: Abdominal distension and pain

EXAM:
CT ABDOMEN AND PELVIS WITHOUT CONTRAST
TECHNIQUE: Multidetector CT imaging of the abdomen and pelvis was performed
following the standard protocol without IV contrast.

[Series 3: a/p w/o 5mm · axial · non-contrast · 0.96mm/px · z∈[+844,+1214]mm · 13 of 82 slices shown, 15 images]
[im 4/82  soft-tissue]
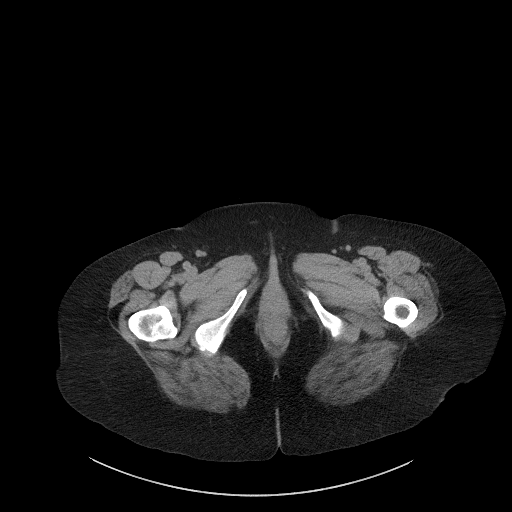
[im 4/82  bone]
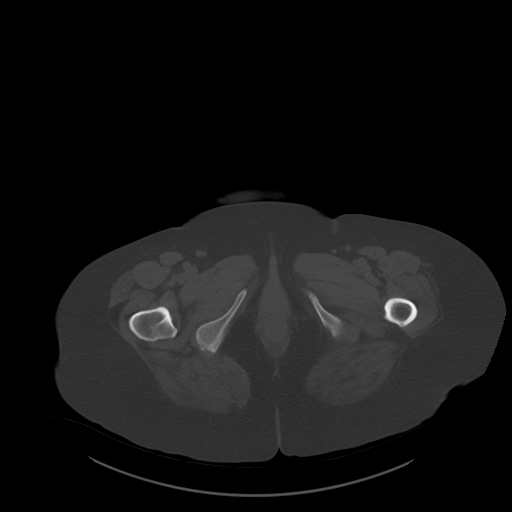
[im 11/82  soft-tissue]
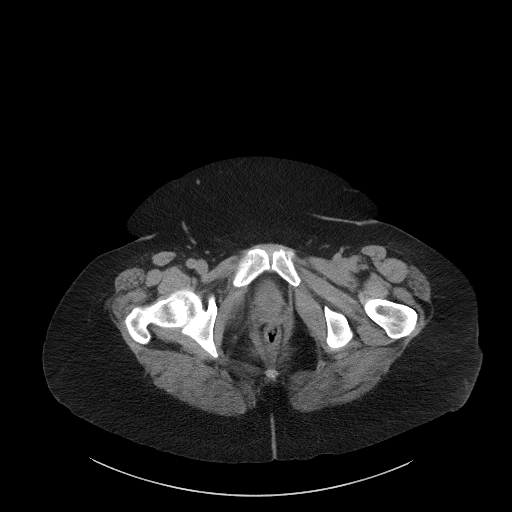
[im 17/82  soft-tissue]
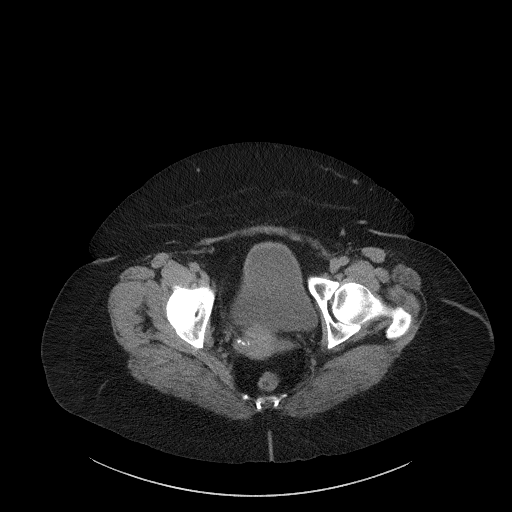
[im 24/82  soft-tissue]
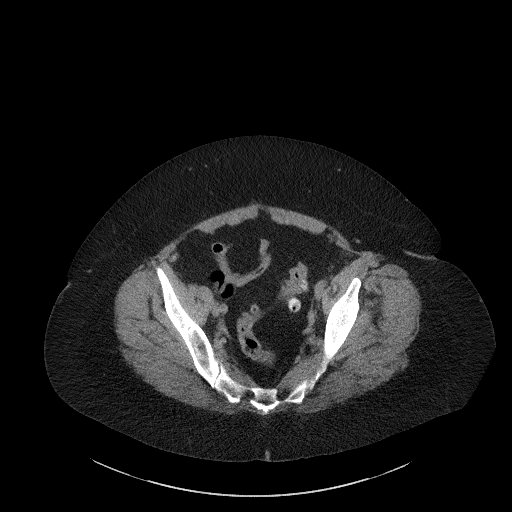
[im 28/82  soft-tissue]
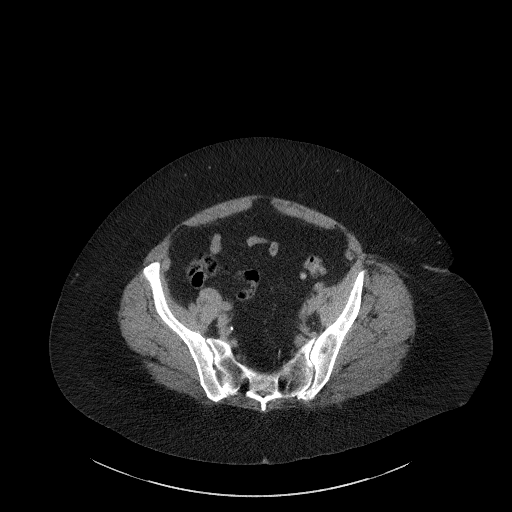
[im 34/82  soft-tissue]
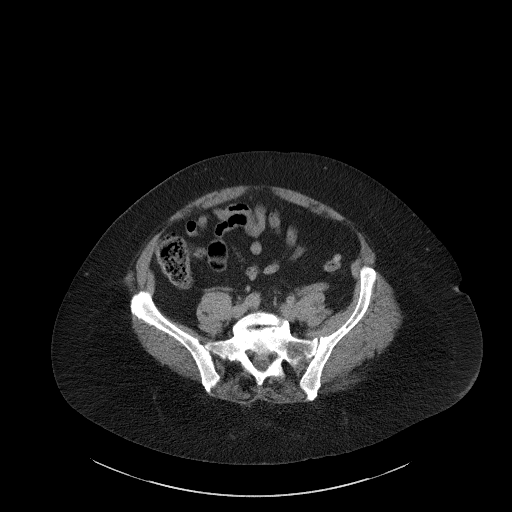
[im 41/82  soft-tissue]
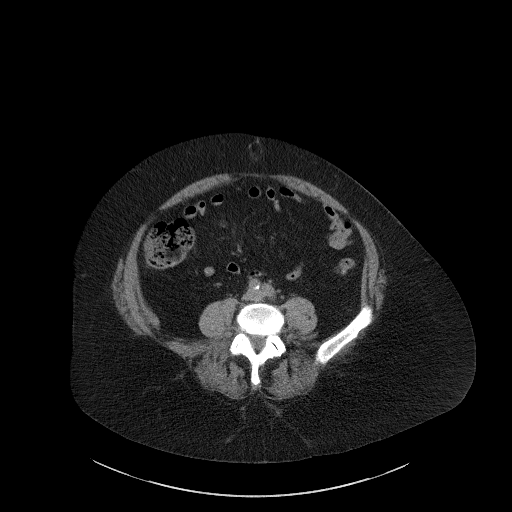
[im 48/82  soft-tissue]
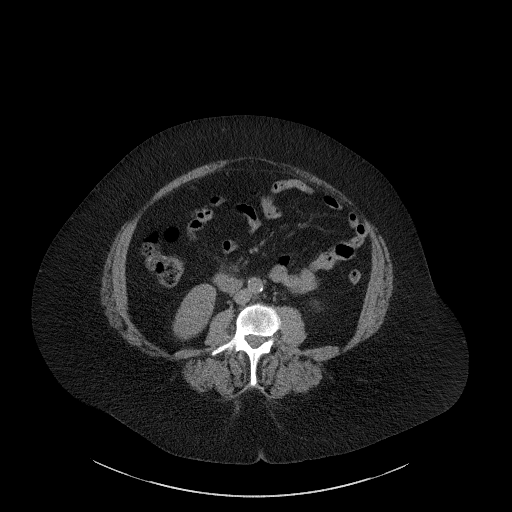
[im 55/82  soft-tissue]
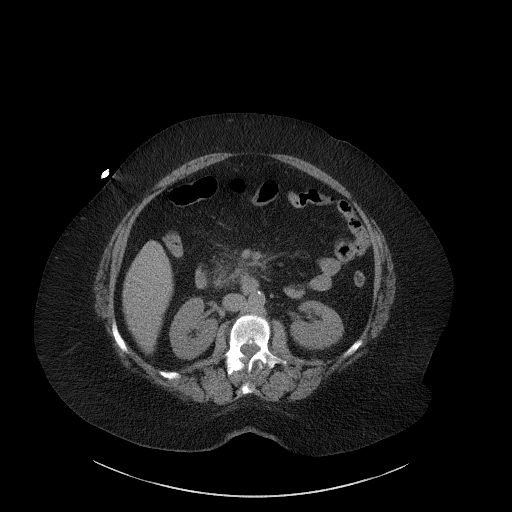
[im 55/82  bone]
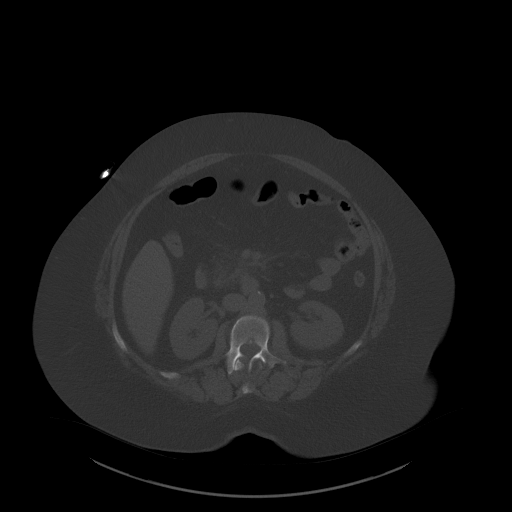
[im 58/82  soft-tissue]
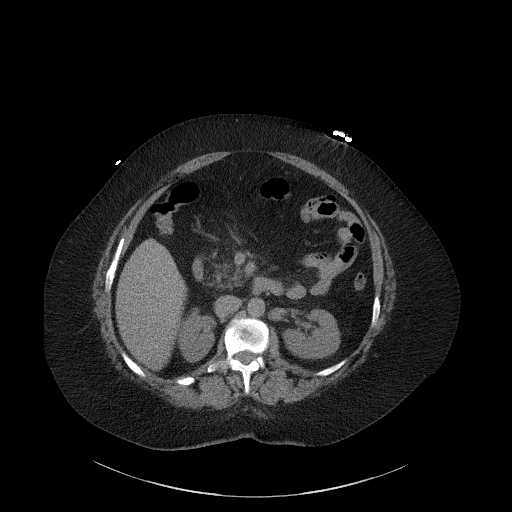
[im 65/82  soft-tissue]
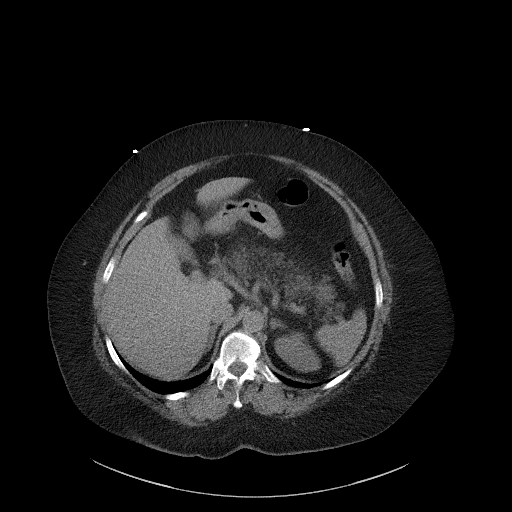
[im 71/82  soft-tissue]
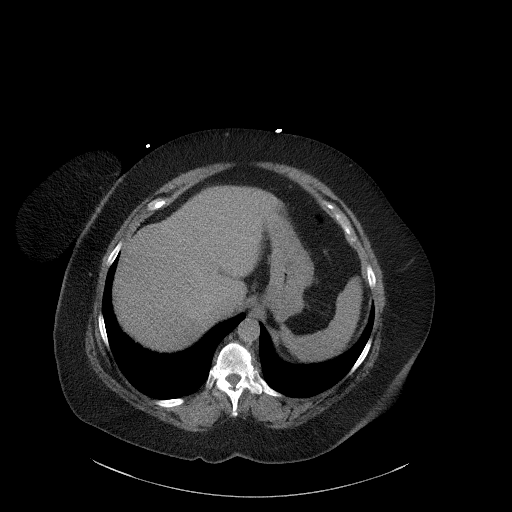
[im 78/82  soft-tissue]
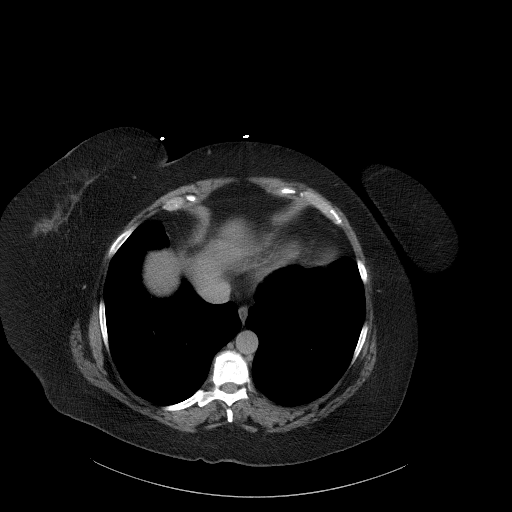

[Series 6: a/p w/o cor · coronal · non-contrast · 0.72mm/px · 3 of 165 slices shown]
[im 55/165  soft-tissue]
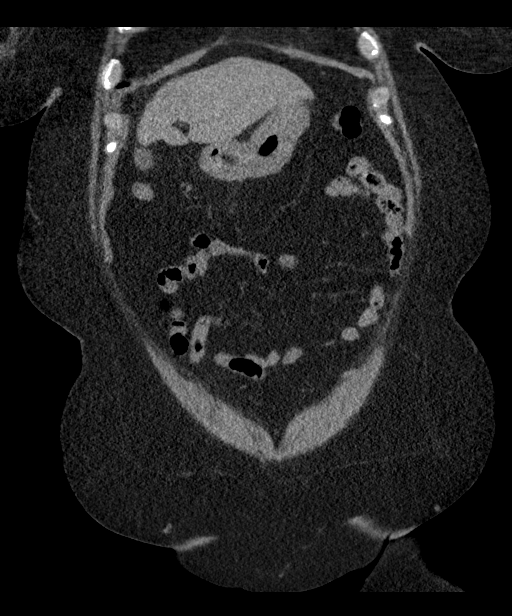
[im 73/165  soft-tissue]
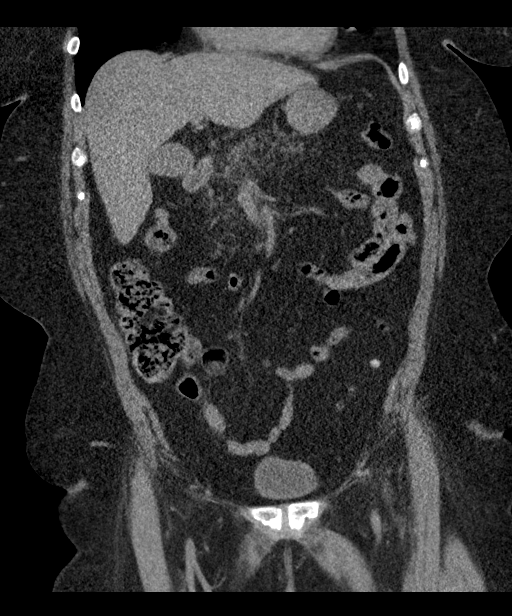
[im 92/165  soft-tissue]
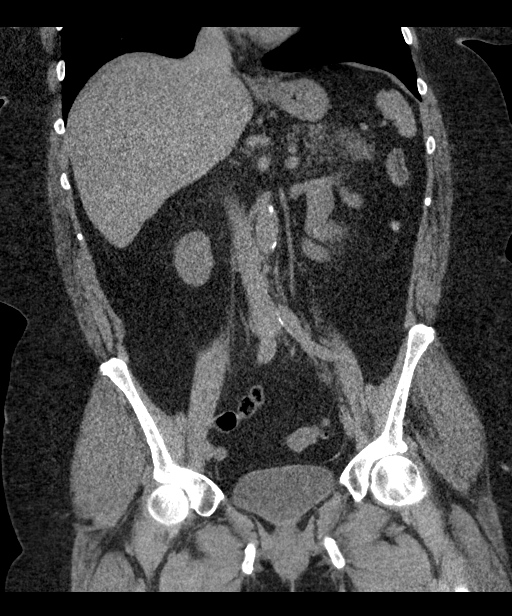

[16 of 46 positions shown; findings below may reference images not displayed]

FINDINGS: Lower chest: No acute abnormality.

Hepatobiliary: Liver is well visualized within normal limits.
Gallbladder is well distended with dependent gallstones. No
definitive inflammatory changes are seen.

Pancreas: Pancreas demonstrates diffuse fatty infiltration. The
margins are mildly indistinct which may represent some very early
inflammatory change.

Spleen: Normal in size without focal abnormality.

Adrenals/Urinary Tract: Adrenal glands are within normal limits.
Kidneys demonstrate no renal calculi or obstructive changes. The
ureters are within normal limits. Bladder is partially distended.

Stomach/Bowel: Scattered diverticular change of the colon is noted
without evidence of diverticulitis. The appendix is within normal
limits. Small bowel and stomach appear unremarkable.

Vascular/Lymphatic: Aortic atherosclerosis. No enlarged abdominal or
pelvic lymph nodes.

Reproductive: Uterus and bilateral adnexa are unremarkable.

Other: No abdominal wall hernia or abnormality. No abdominopelvic
ascites.

Musculoskeletal: No acute or significant osseous findings.
IMPRESSION: Suggestion of mild inflammatory changes within the pancreas which
may represent some very early pancreatitis.

Cholelithiasis without complicating factors.

No other focal abnormality is noted.

## 2021-09-27 IMAGING — US US ABDOMEN COMPLETE
1 series · 14 of 25 positions shown · non-contrast
Comparison: CT abd.

CLINICAL DATA: Gallstones and chronic RUQ pain

EXAM:
ABDOMEN ULTRASOUND COMPLETE

[Series 1: us abdomen complete · 0.22mm/px · 14 of 90 slices shown]
[im 1/90]
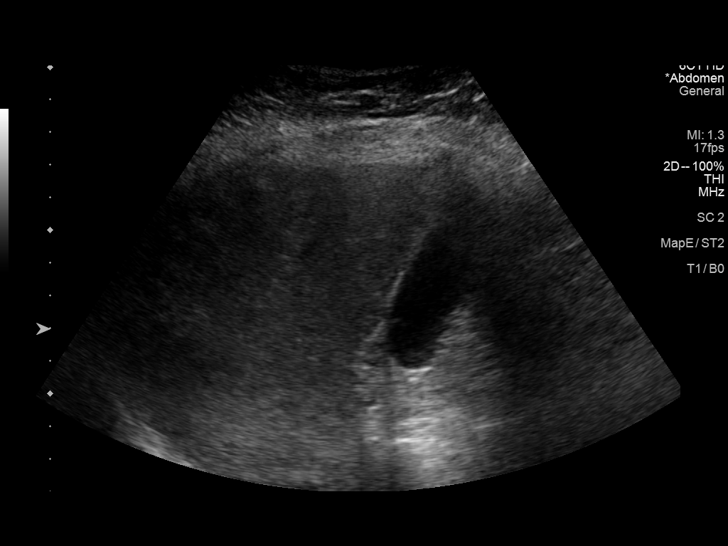
[im 8/90]
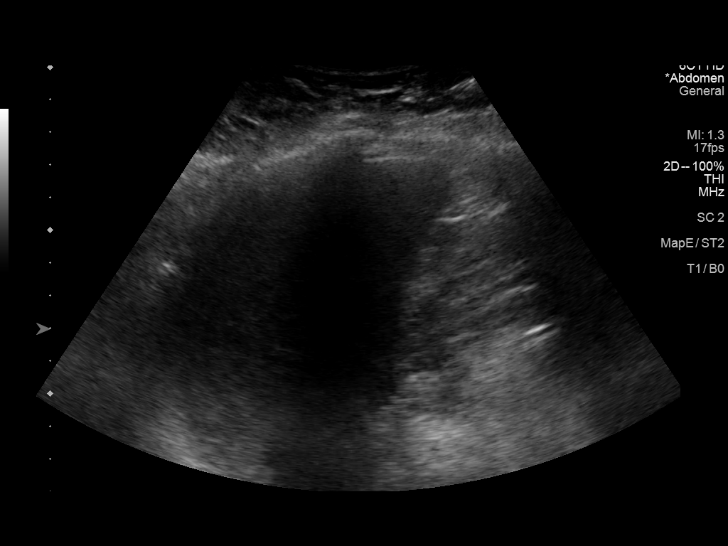
[im 15/90]
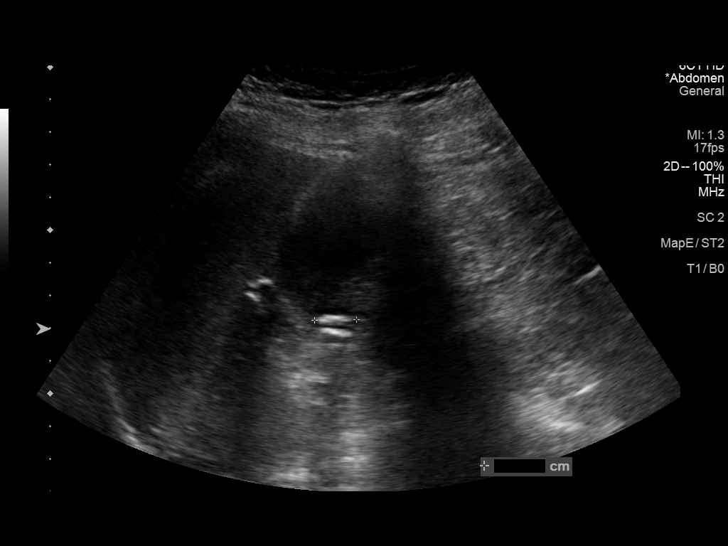
[im 23/90]
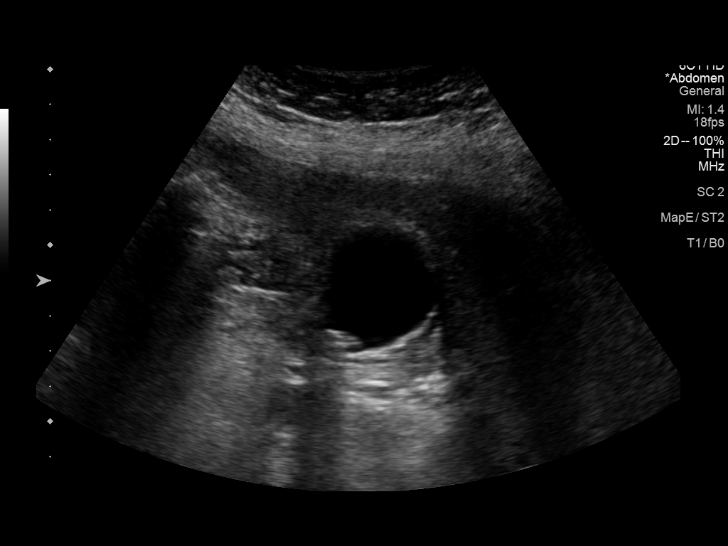
[im 30/90]
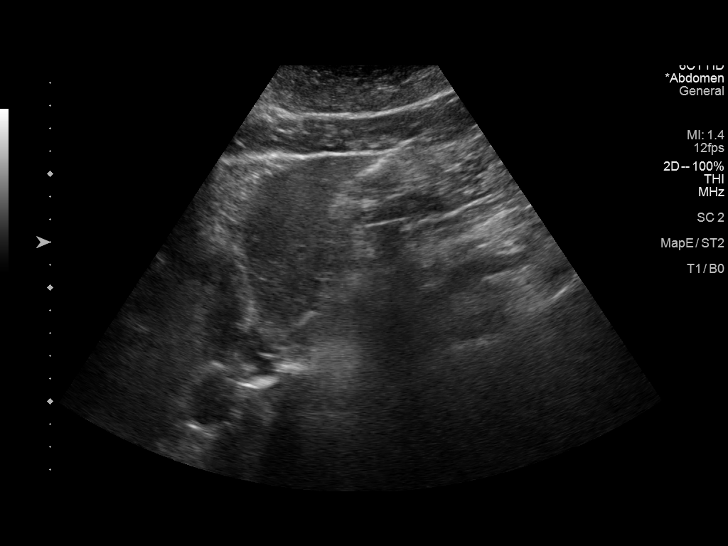
[im 34/90]
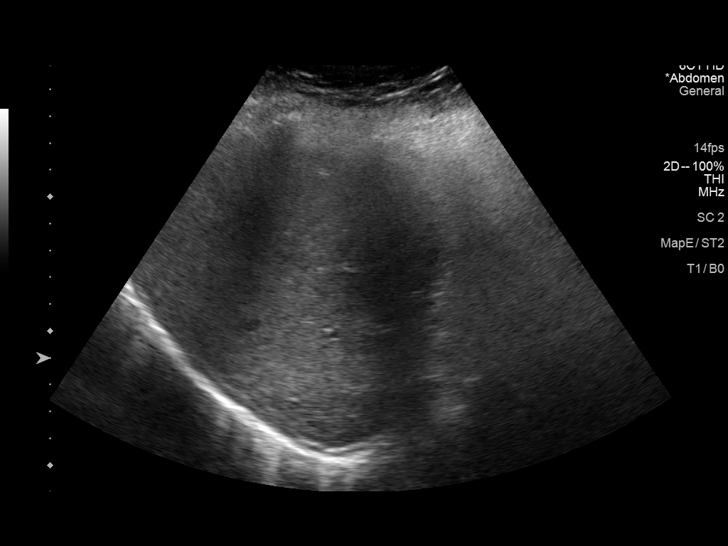
[im 41/90]
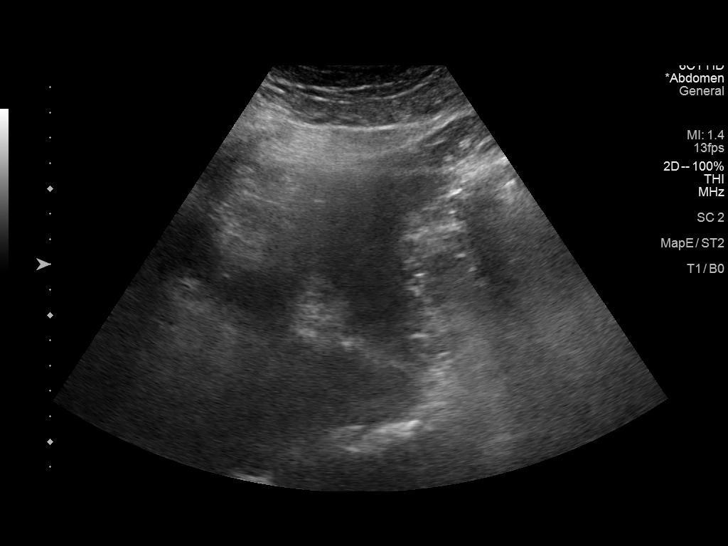
[im 49/90]
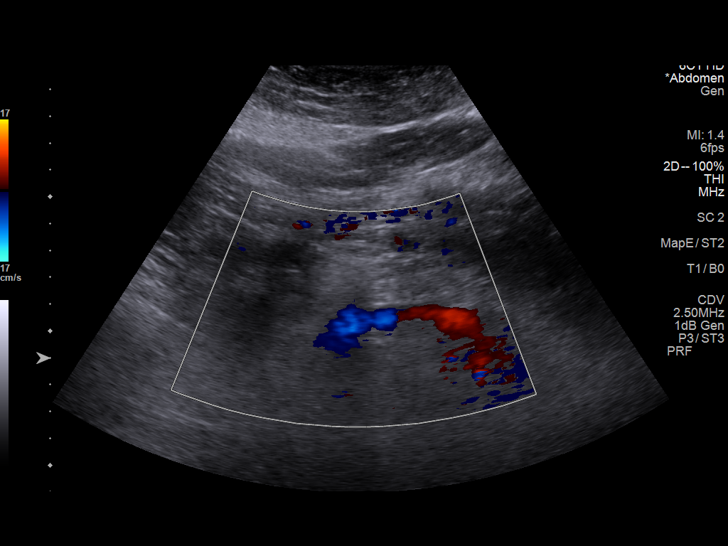
[im 56/90]
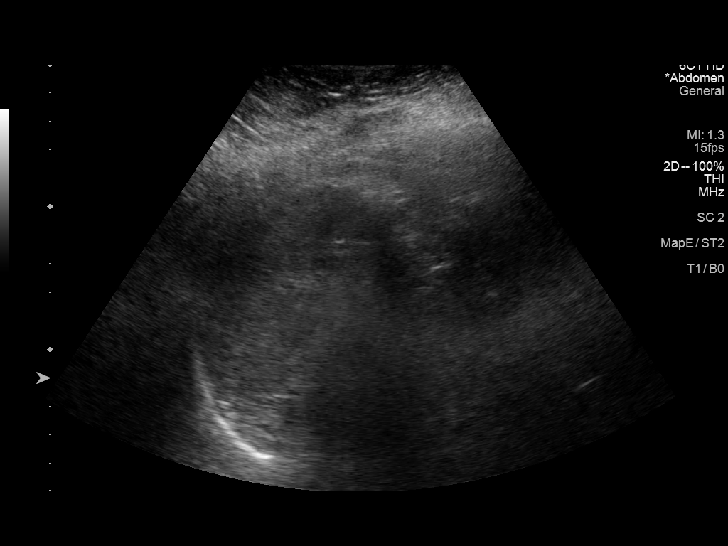
[im 60/90]
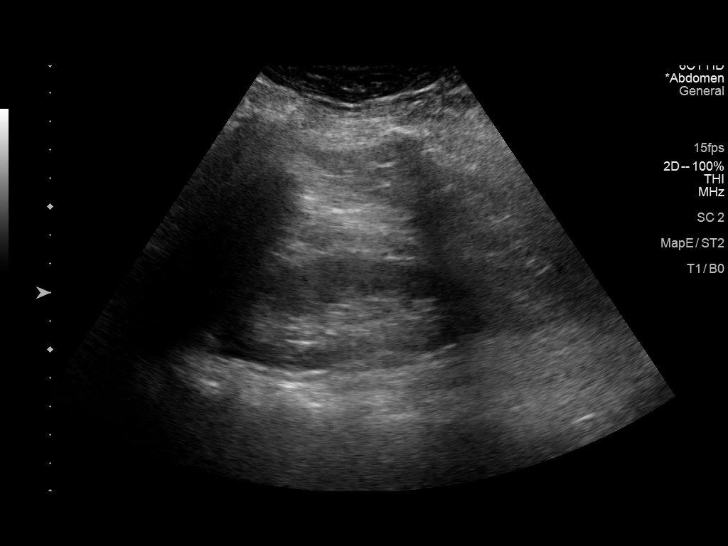
[im 67/90]
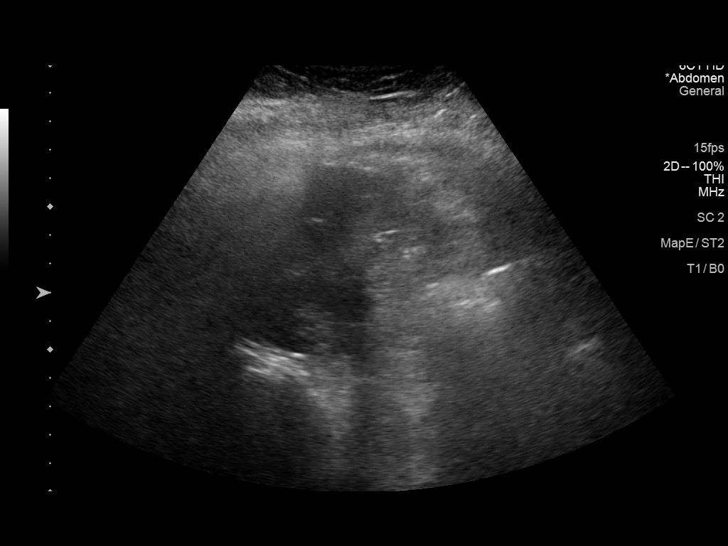
[im 75/90]
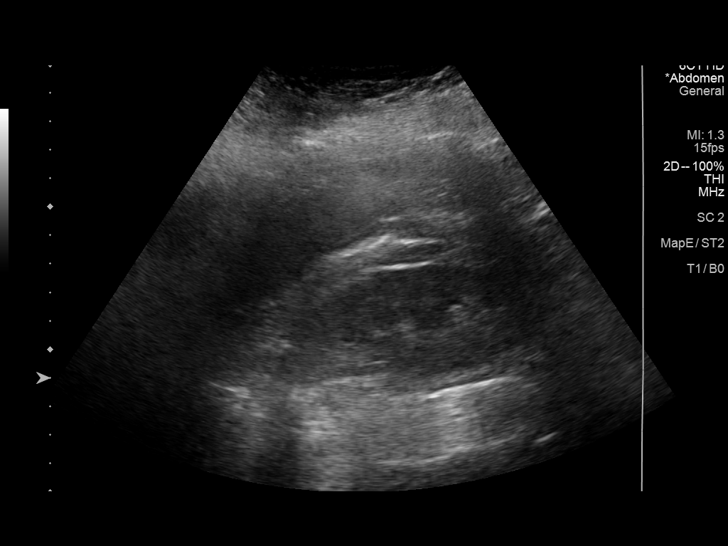
[im 82/90]
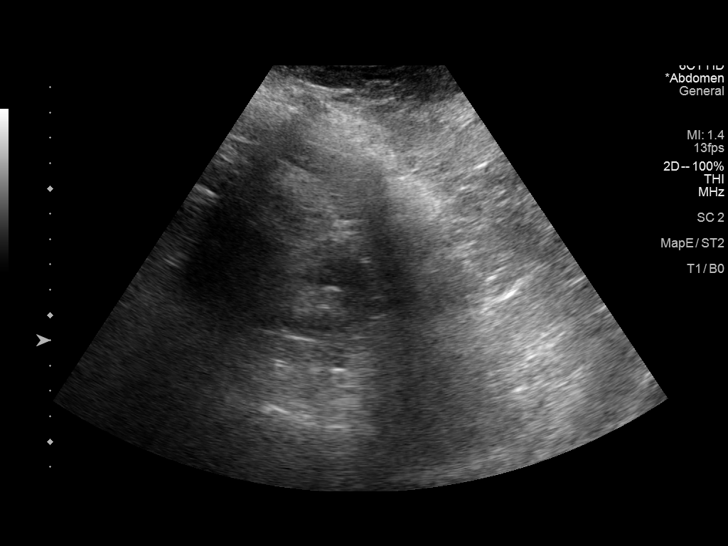
[im 90/90]
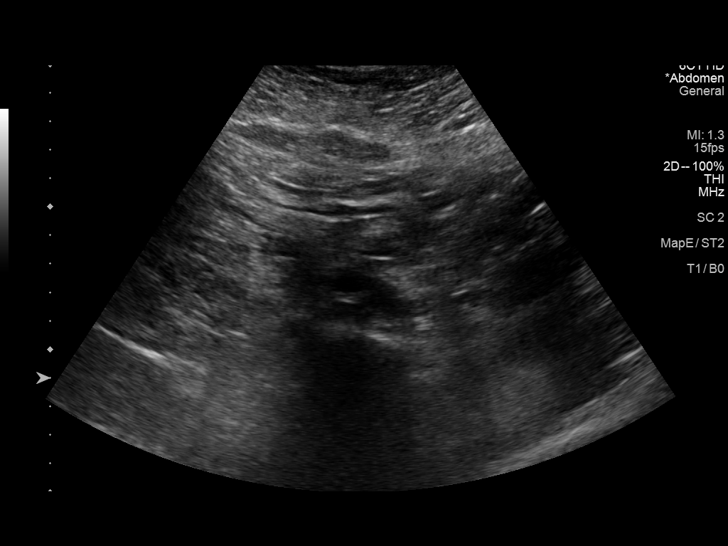

[14 of 25 positions shown; findings below may reference images not displayed]

FINDINGS: Gallbladder: Gallstones measuring up to 1.3 cm. No pericholecystic
fluid or wall thickening visualized. No sonographic Murphy sign
noted by sonographer.

Common bile duct: Diameter: 2.1 mm

Liver: No focal lesion identified. Within normal limits in
parenchymal echogenicity. Portal vein is patent on color Doppler
imaging with normal direction of blood flow towards the liver.

IVC: No abnormality visualized.

Pancreas: Visualized portion unremarkable.

Spleen: Size and appearance within normal limits.

Right Kidney: Length: 9.6 cm. Echogenicity within normal limits. No
mass or hydronephrosis visualized.

Left Kidney: Length: 9.7 cm. Echogenicity within normal limits. No
mass or hydronephrosis visualized.

Abdominal aorta: No aneurysm visualized.

Other findings: None.
IMPRESSION: Cholelithiasis without evidence of acute cholecystitis.

## 2021-11-14 IMAGING — RF DG CHOLANGIOGRAM OPERATIVE
1 series · 4 of 4 positions shown · non-contrast
Comparison: None.

CLINICAL DATA: 65-year-old female with cholelithiasis

EXAM:
INTRAOPERATIVE CHOLANGIOGRAM
TECHNIQUE: Cholangiographic images from the C-arm fluoroscopic device were
submitted for interpretation post-operatively. Please see the
procedural report for the amount of contrast and the fluoroscopy
time utilized.

[Series 1: run · 4 of 49 frames shown]
[frame 8/49]
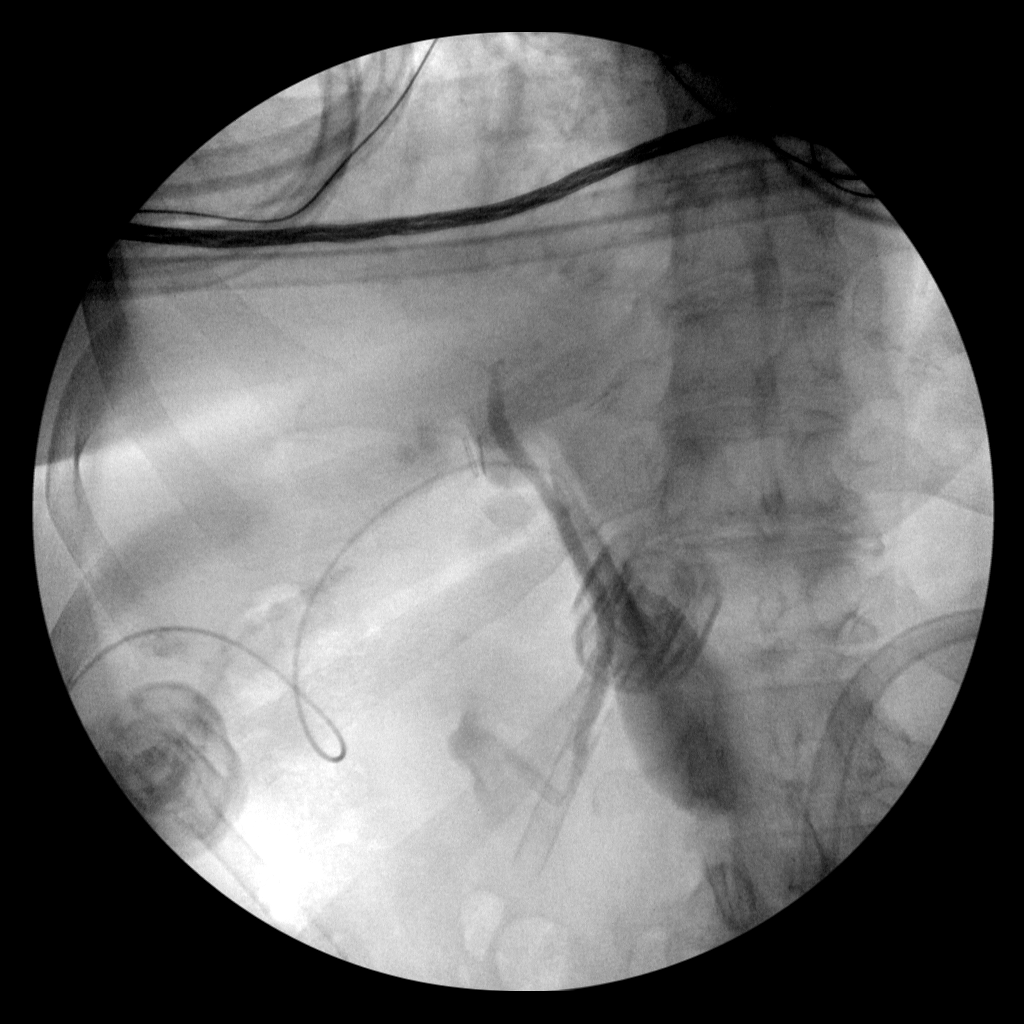
[frame 23/49]
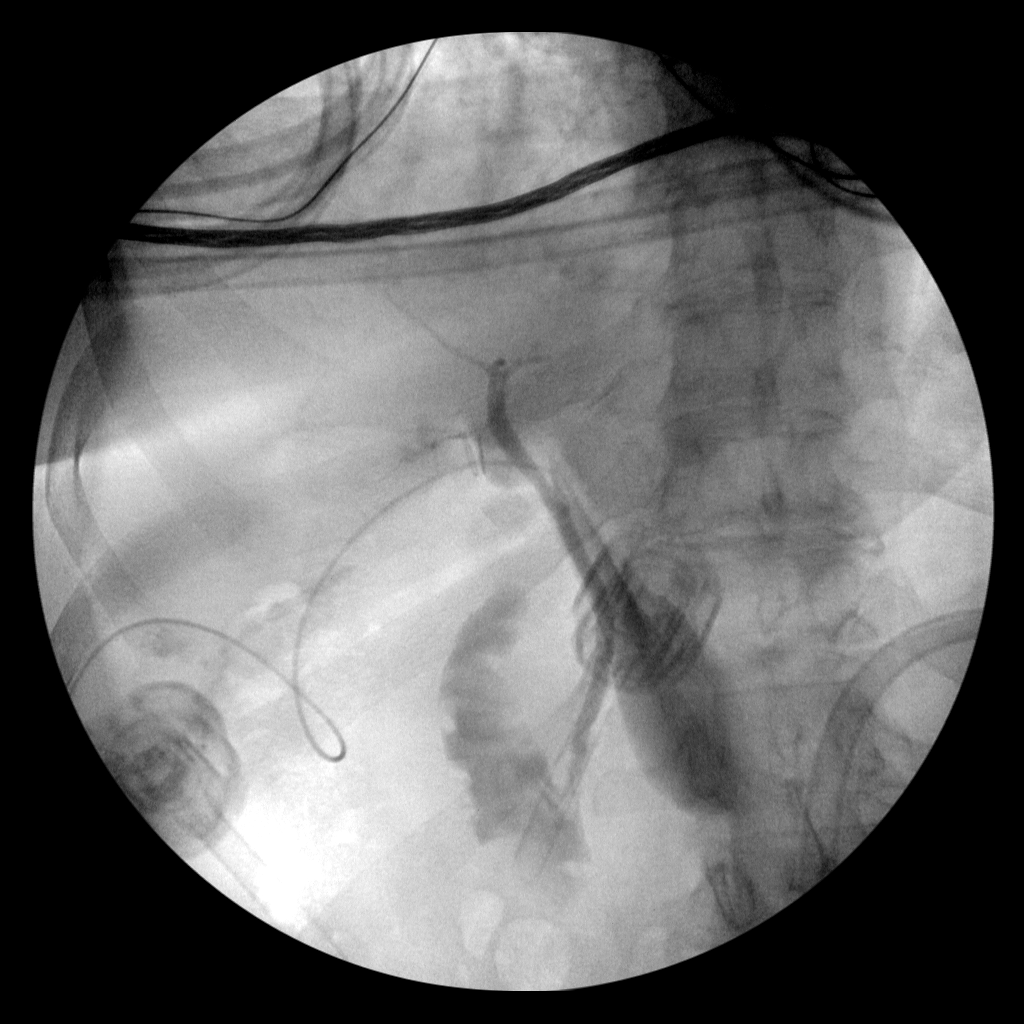
[frame 25/49]
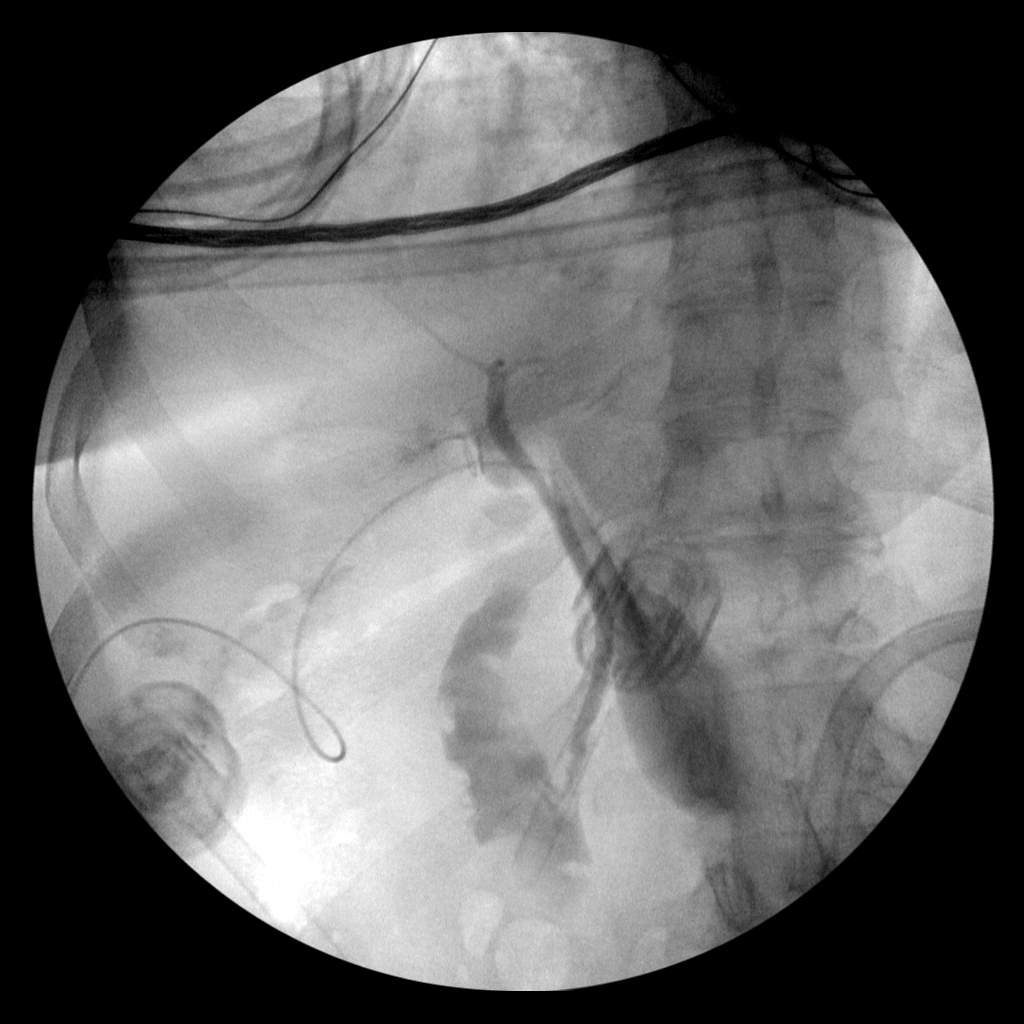
[frame 42/49]
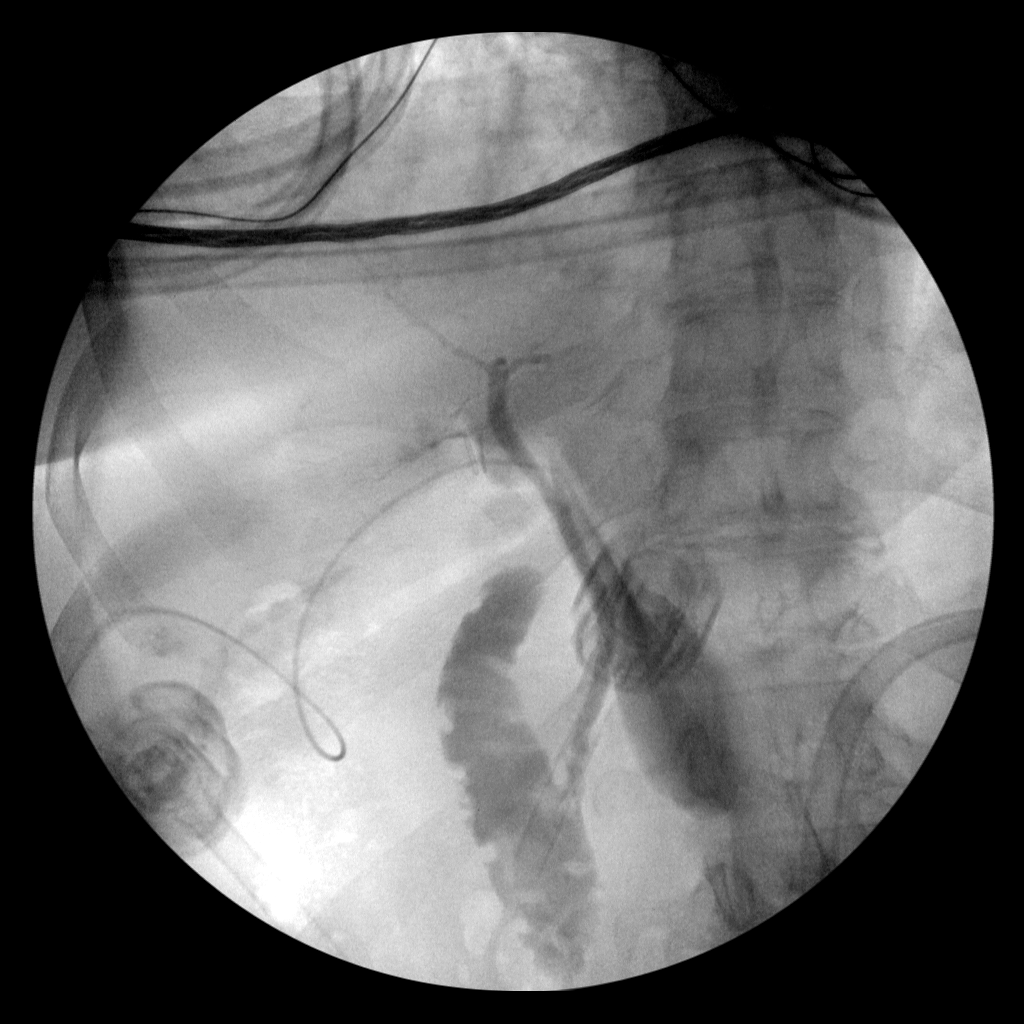

[4 of 4 positions shown; findings below may reference images not displayed]

FINDINGS: Surgical instruments project over the upper abdomen.

There is cannulation of the cystic duct/gallbladder neck, with
antegrade infusion of contrast. Caliber of the extrahepatic ductal
system within normal limits.

No definite filling defect within the extrahepatic ducts identified.

Free flow of contrast across the ampulla.
IMPRESSION: Intraoperative cholangiogram demonstrates extrahepatic biliary ducts
of unremarkable caliber, with no definite filling defects
identified. Free flow of contrast across the ampulla.

Please refer to the dictated operative report for full details of
intraoperative findings and procedure
